# Patient Record
Sex: Male | Born: 1985 | Race: White | Hispanic: No | Marital: Married | Smoking: Never smoker
Health system: Southern US, Community
[De-identification: ages and names within clinical notes are randomized; demographics above are authoritative.]

## PROBLEM LIST (undated history)

## (undated) DIAGNOSIS — E119 Type 2 diabetes mellitus without complications: Secondary | ICD-10-CM

## (undated) HISTORY — PX: CYST REMOVAL NECK: SHX6281

## (undated) HISTORY — PX: TONSILLECTOMY: SUR1361

---

## 2006-01-16 ENCOUNTER — Emergency Department: Payer: Self-pay | Admitting: Emergency Medicine

## 2010-03-10 ENCOUNTER — Emergency Department: Payer: Self-pay | Admitting: Emergency Medicine

## 2010-03-14 ENCOUNTER — Ambulatory Visit: Payer: Self-pay | Admitting: Surgery

## 2013-04-09 ENCOUNTER — Other Ambulatory Visit: Payer: Self-pay | Admitting: Surgery

## 2013-04-09 LAB — BASIC METABOLIC PANEL
ANION GAP: 5 — AB (ref 7–16)
BUN: 13 mg/dL (ref 7–18)
Calcium, Total: 9.8 mg/dL (ref 8.5–10.1)
Chloride: 102 mmol/L (ref 98–107)
Co2: 30 mmol/L (ref 21–32)
Creatinine: 1.2 mg/dL (ref 0.60–1.30)
EGFR (Non-African Amer.): >60
Glucose: 122 mg/dL — ABNORMAL HIGH (ref 65–99)
OSMOLALITY: 275 (ref 275–301)
Potassium: 4 mmol/L (ref 3.5–5.1)
Sodium: 137 mmol/L (ref 136–145)

## 2013-04-09 LAB — CBC WITH DIFFERENTIAL/PLATELET
Basophil #: 0 10*3/uL (ref 0.0–0.1)
Basophil %: 0.3 %
Eosinophil #: 0.1 10*3/uL (ref 0.0–0.7)
Eosinophil %: 1.3 %
HCT: 43.7 % (ref 40.0–52.0)
HGB: 15.1 g/dL (ref 13.0–18.0)
LYMPHS ABS: 2.6 10*3/uL (ref 1.0–3.6)
Lymphocyte %: 24.2 %
MCH: 32.6 pg (ref 26.0–34.0)
MCHC: 34.7 g/dL (ref 32.0–36.0)
MCV: 94 fL (ref 80–100)
MONO ABS: 0.7 x10 3/mm (ref 0.2–1.0)
MONOS PCT: 6.8 %
Neutrophil #: 7.3 10*3/uL — ABNORMAL HIGH (ref 1.4–6.5)
Neutrophil %: 67.4 %
Platelet: 366 10*3/uL (ref 150–440)
RBC: 4.65 10*6/uL (ref 4.40–5.90)
RDW: 12.5 % (ref 11.5–14.5)
WBC: 10.8 10*3/uL — AB (ref 3.8–10.6)

## 2013-05-07 ENCOUNTER — Ambulatory Visit: Payer: Self-pay | Admitting: Surgery

## 2013-05-10 LAB — PATHOLOGY REPORT

## 2014-03-06 ENCOUNTER — Emergency Department: Payer: Self-pay | Admitting: Internal Medicine

## 2014-05-24 ENCOUNTER — Ambulatory Visit: Payer: Self-pay | Admitting: Family Medicine

## 2014-07-16 NOTE — Op Note (Signed)
PATIENT NAME:  Zachary JewettBROOKS, Adrienne A MR#:  [id number] DATE OF BIRTH:  Sep 30, 1985  DATE OF PROCEDURE:  05/07/2013  PREOPERATIVE DIAGNOSIS: Posterior neck mass.   POSTOPERATIVE DIAGNOSIS: Posterior neck mass.   PROCEDURE: Excisional biopsy of a posterior neck mass.   SURGEON: Dionne Miloichard Welby Montminy, M.D.   ASSISTANT: Parks RangerBethany Applebaum, PA-S  ANESTHESIA: IV sedation and local anesthetic.   INDICATIONS: This is a patient with a posterior neck mass. It has been incised and drained in the past for infection and now is growing and recurring. He requests excisional biopsy.   Of note, his family has history of malignant hyperthermia and therefore we are doing this under IV sedation and local anesthetic to avoid triggering agents.  Preoperatively, we discussed rationale for surgery, the options of observation, risk of bleeding, infection, recurrence, and cosmetic deformity including open wound. This was all reviewed for him in the preop holding area. No family was present.   FINDINGS: 2 cm mass excised via a 5 cm incision, closed in an intermediate fashion.   DESCRIPTION OF PROCEDURE: The patient was placed in a well-padded prone position, prepped and draped in a sterile fashion. He was given IV sedation and then lidocaine with epinephrine was infiltrated in skin and subcutaneous tissues. On the previously marked visible and palpable mass, a lenticular-shaped incision was drawn out then executed with sharp dissection and excised and sent off for examination, labeled posterior neck mass. The area was checked for hemostasis with electrocautery and found to be adequate. The wound was re-infiltrated with Marcaine for a total of 30 mL, and then once assuring that hemostasis was adequate, the wound was closed in 2 layers with 2-0 Vicryl followed by 4-0 subcuticular Monocryl. Steri-Strips, Mastisol, and sterile dressings were placed.   The patient tolerated the procedure well. There were no complications. He was taken to  the recovery room in stable condition to be discharged in the care of his family. Follow up in 10 days.  ____________________________ Adah Salvageichard E. Excell Seltzerooper, MD rec:sb D: 05/07/2013 09:04:04 ET T: 05/07/2013 09:19:12 ET JOB#: [id number]  cc: Adah Salvageichard E. Excell Seltzerooper, MD, <Dictator> Lattie HawICHARD E Eligh Rybacki MD ELECTRONICALLY SIGNED 05/07/2013 13:05

## 2015-06-21 ENCOUNTER — Encounter: Payer: Self-pay | Admitting: Emergency Medicine

## 2015-06-21 ENCOUNTER — Emergency Department
Admission: EM | Admit: 2015-06-21 | Discharge: 2015-06-21 | Disposition: A | Discharge status: Home / Self Care | Payer: BLUE CROSS/BLUE SHIELD | Attending: Emergency Medicine | Admitting: Emergency Medicine

## 2015-06-21 DIAGNOSIS — E138 Other specified diabetes mellitus with unspecified complications: Secondary | ICD-10-CM | POA: Diagnosis not present

## 2015-06-21 DIAGNOSIS — R42 Dizziness and giddiness: Secondary | ICD-10-CM | POA: Diagnosis present

## 2015-06-21 HISTORY — DX: Type 2 diabetes mellitus without complications: E11.9

## 2015-06-21 LAB — URINALYSIS COMPLETE WITH MICROSCOPIC (ARMC ONLY)
BILIRUBIN URINE: NEGATIVE
Bacteria, UA: NONE SEEN
Glucose, UA: >500 mg/dL — AB
Hgb urine dipstick: NEGATIVE
LEUKOCYTES UA: NEGATIVE
Nitrite: NEGATIVE
PH: 6 (ref 5.0–8.0)
Protein, ur: NEGATIVE mg/dL
SPECIFIC GRAVITY, URINE: 1.035 — AB (ref 1.005–1.030)
SQUAMOUS EPITHELIAL / LPF: NONE SEEN

## 2015-06-21 LAB — CBC WITH DIFFERENTIAL/PLATELET
Basophils Absolute: 0.1 10*3/uL (ref 0–0.1)
Basophils Relative: 1 %
Eosinophils Absolute: 0.1 10*3/uL (ref 0–0.7)
Eosinophils Relative: 1 %
HEMATOCRIT: 47.5 % (ref 40.0–52.0)
HEMOGLOBIN: 16.1 g/dL (ref 13.0–18.0)
LYMPHS ABS: 3.2 10*3/uL (ref 1.0–3.6)
Lymphocytes Relative: 27 %
MCH: 31.7 pg (ref 26.0–34.0)
MCHC: 33.8 g/dL (ref 32.0–36.0)
MCV: 93.8 fL (ref 80.0–100.0)
MONO ABS: 0.7 10*3/uL (ref 0.2–1.0)
MONOS PCT: 6 %
NEUTROS ABS: 7.5 10*3/uL — AB (ref 1.4–6.5)
NEUTROS PCT: 65 %
Platelets: 303 10*3/uL (ref 150–440)
RBC: 5.06 MIL/uL (ref 4.40–5.90)
RDW: 13 % (ref 11.5–14.5)
WBC: 11.6 10*3/uL — ABNORMAL HIGH (ref 3.8–10.6)

## 2015-06-21 LAB — GLUCOSE, CAPILLARY
Glucose-Capillary: 514 mg/dL — ABNORMAL HIGH (ref 65–99)
Glucose-Capillary: 395 mg/dL — ABNORMAL HIGH (ref 65–99)
Glucose-Capillary: 307 mg/dL — ABNORMAL HIGH (ref 65–99)

## 2015-06-21 LAB — COMPREHENSIVE METABOLIC PANEL
ALK PHOS: 115 U/L (ref 38–126)
ALT: 58 U/L (ref 17–63)
ANION GAP: 13 (ref 5–15)
AST: 30 U/L (ref 15–41)
Albumin: 5.5 g/dL — ABNORMAL HIGH (ref 3.5–5.0)
BILIRUBIN TOTAL: 1.3 mg/dL — AB (ref 0.3–1.2)
BUN: 18 mg/dL (ref 6–20)
CALCIUM: 9.9 mg/dL (ref 8.9–10.3)
CO2: 27 mmol/L (ref 22–32)
Chloride: 90 mmol/L — ABNORMAL LOW (ref 101–111)
Creatinine, Ser: 1.16 mg/dL (ref 0.61–1.24)
GLUCOSE: 531 mg/dL — AB (ref 65–99)
POTASSIUM: 3.7 mmol/L (ref 3.5–5.1)
Sodium: 130 mmol/L — ABNORMAL LOW (ref 135–145)
TOTAL PROTEIN: 8.3 g/dL — AB (ref 6.5–8.1)

## 2015-06-21 MED ORDER — SODIUM CHLORIDE 0.9 % IV BOLUS (SEPSIS)
1000.0000 mL | Freq: Once | INTRAVENOUS | Status: AC
  Administered 2015-06-21: 1000 mL via INTRAVENOUS

## 2015-06-21 MED ORDER — BLOOD GLUCOSE MONITOR KIT: PACK | Status: DC

## 2015-06-21 MED ORDER — METFORMIN HCL 500 MG PO TABS: 500.0000 mg | ORAL_TABLET | Freq: Two times a day (BID) | ORAL | Status: DC

## 2015-06-21 MED ORDER — METFORMIN HCL 500 MG PO TABS
ORAL_TABLET | ORAL | Status: AC
  Administered 2015-06-21: 500 mg via ORAL
  Filled 2015-06-21: qty 1

## 2015-06-21 MED ORDER — METFORMIN HCL 500 MG PO TABS
500.0000 mg | ORAL_TABLET | Freq: Once | ORAL | Status: AC
  Administered 2015-06-21: 500 mg via ORAL
  Filled 2015-06-21: qty 1

## 2015-06-21 MED ORDER — DIPHENHYDRAMINE HCL 50 MG/ML IJ SOLN
INTRAMUSCULAR | Status: AC
  Filled 2015-06-21: qty 1

## 2015-06-21 MED ORDER — INSULIN ASPART 100 UNIT/ML ~~LOC~~ SOLN
6.0000 [IU] | Freq: Once | SUBCUTANEOUS | Status: AC
  Administered 2015-06-21: 6 [IU] via INTRAVENOUS

## 2015-06-21 MED ORDER — INSULIN ASPART 100 UNIT/ML ~~LOC~~ SOLN
SUBCUTANEOUS | Status: AC
  Filled 2015-06-21: qty 6

## 2015-06-21 MED ORDER — DIPHENHYDRAMINE HCL 50 MG/ML IJ SOLN
25.0000 mg | Freq: Once | INTRAMUSCULAR | Status: AC
  Administered 2015-06-21: 25 mg via INTRAVENOUS

## 2015-06-21 NOTE — Discharge Instructions (Signed)
Please seek medical attention for any high fevers, chest pain, shortness of breath, change in behavior, persistent vomiting, bloody stool or any other new or concerning symptoms.  Diabetes and Standards of Medical Care Diabetes is complicated. You may find that your diabetes team includes a dietitian, nurse, diabetes educator, eye doctor, and more. To help everyone know what is going on and to help you get the care you deserve, the following schedule of care was developed to help keep you on track. Below are the tests, exams, vaccines, medicines, education, and plans you will need. HbA1c test This test shows how well you have controlled your glucose over the past 2-3 months. It is used to see if your diabetes management plan needs to be adjusted.   It is performed at least 2 times a year if you are meeting treatment goals.  It is performed 4 times a year if therapy has changed or if you are not meeting treatment goals. Blood pressure test  This test is performed at every routine medical visit. The goal is less than 140/90 mm Hg for most people, but 130/80 mm Hg in some cases. Ask your health care provider about your goal. Dental exam  Follow up with the dentist regularly. Eye exam  If you are diagnosed with type 1 diabetes as a child, get an exam upon reaching the age of 58 years or older and having had diabetes for 3-5 years. Yearly eye exams are recommended after that initial eye exam.  If you are diagnosed with type 1 diabetes as an adult, get an exam within 5 years of diagnosis and then yearly.  If you are diagnosed with type 2 diabetes, get an exam as soon as possible after the diagnosis and then yearly. Foot care exam  Visual foot exams are performed at every routine medical visit. The exams check for cuts, injuries, or other problems with the feet.  You should have a complete foot exam performed every year. This exam includes an inspection of the structure and skin of your feet, a  check of the pulses in your feet, and a check of the sensation in your feet.  Type 1 diabetes: The first exam is performed 5 years after diagnosis.  Type 2 diabetes: The first exam is performed at the time of diagnosis.  Check your feet nightly for cuts, injuries, or other problems with your feet. Tell your health care provider if anything is not healing. Kidney function test (urine microalbumin)  This test is performed once a year.  Type 1 diabetes: The first test is performed 5 years after diagnosis.  Type 2 diabetes: The first test is performed at the time of diagnosis.  A serum creatinine and estimated glomerular filtration rate (eGFR) test is done once a year to assess the level of chronic kidney disease (CKD), if present. Lipid profile (cholesterol, HDL, LDL, triglycerides)  Performed every 5 years for most people.  The goal for LDL is less than 100 mg/dL. If you are at high risk, the goal is less than 70 mg/dL.  The goal for HDL is 40 mg/dL-50 mg/dL for men and 50 mg/dL-60 mg/dL for women. An HDL cholesterol of 60 mg/dL or higher gives some protection against heart disease.  The goal for triglycerides is less than 150 mg/dL. Immunizations  The flu (influenza) vaccine is recommended yearly for every person 19 months of age or older who has diabetes.  The pneumonia (pneumococcal) vaccine is recommended for every person 29 years of age  or older who has diabetes. Adults 75 years of age or older may receive the pneumonia vaccine as a series of two separate shots.  The hepatitis B vaccine is recommended for adults shortly after they have been diagnosed with diabetes.  The Tdap (tetanus, diphtheria, and pertussis) vaccine should be given:  According to normal childhood vaccination schedules, for children.  Every 10 years, for adults who have diabetes. Diabetes self-management education  Education is recommended at diagnosis and ongoing as needed. Treatment plan  Your  treatment plan is reviewed at every medical visit.   This information is not intended to replace advice given to you by your health care provider. Make sure you discuss any questions you have with your health care provider.   Document Released: 01/06/2009 Document Revised: 04/01/2014 Document Reviewed: 08/11/2012 Elsevier Interactive Patient Education [employer].

## 2015-06-21 NOTE — ED Notes (Addendum)
C/O increased thirst x 1 month, worsening over past few weeks.  Also c/o dizziness, intermittent blurred vision, frequent urination at night.  2015 diagnosed with diabetes, not started on medications at that time.

## 2015-06-21 NOTE — ED Notes (Signed)
C/o itching red rash to bilateral forearms.  Flat red rash noted to forearms.  No SOB/ DOE.  Will alert Dr. Derrill KayGoodman.

## 2015-06-21 NOTE — ED Provider Notes (Signed)
[facility] Emergency Department Provider Note    ____________________________________________  Time seen: ~1900  I have reviewed the triage vital signs and the nursing notes.   HISTORY  Chief Complaint Dizziness   History limited by: Not Limited   HPI Zachary Alexander is a 30 y.o. male who presents to the emergency department today because of concerns for increased thirst, increased urination and some weakness. He states he started having the increased thirst and urination about one month ago. Has progressively gotten worse. It is severe. He drinks multiple counts of fluid each day. He urinates multiple times including at night. He states that 2 years ago he was told he had borderline diabetes and was told that he could likely manage it with exercise and diet. States he does have some family history of diabetes. He states roughly 1 month ago he was treated with antibiotics for URI. Denies any recent fevers.    Past Medical History  Diagnosis Date  . Diabetes mellitus without complication (HCC)     There are no active problems to display for this patient.   Past Surgical History  Procedure Laterality Date  . Tonsillectomy      No current outpatient prescriptions on file.  Allergies Penicillins and Codeine  No family history on file.  Social History Social History  Substance Use Topics  . Smoking status: Never Smoker   . Smokeless tobacco: Never Used  . Alcohol Use: Yes     Comment: rare    Review of Systems  Constitutional: Negative for fever. Cardiovascular: Negative for chest pain. Respiratory: Negative for shortness of breath. Gastrointestinal: Negative for abdominal pain, vomiting and diarrhea. Neurological: Negative for headaches, focal weakness or numbness.  10-point ROS otherwise negative.  ____________________________________________   PHYSICAL EXAM:  VITAL SIGNS: ED Triage Vitals  Enc Vitals Group     BP 06/21/15  1845 115/113 mmHg     Pulse Rate 06/21/15 1845 108     Resp 06/21/15 1845 18     Temp 06/21/15 1845 98.8 F (37.1 C)     Temp Source 06/21/15 1845 Oral     SpO2 --      Weight 06/21/15 1845 255 lb (115.667 kg)     Height 06/21/15 1845 5\' 10"  (1.778 m)     Head Cir --      Peak Flow --      Pain Score 06/21/15 1850 2   Constitutional: Alert and oriented. Well appearing and in no distress. Eyes: Conjunctivae are normal. PERRL. Normal extraocular movements. ENT   Head: Normocephalic and atraumatic.   Nose: No congestion/rhinnorhea.   Mouth/Throat: Mucous membranes are moist.   Neck: No stridor. Hematological/Lymphatic/Immunilogical: No cervical lymphadenopathy. Cardiovascular: Tachycardic, regular rhythm.  No murmurs, rubs, or gallops. Respiratory: Normal respiratory effort without tachypnea nor retractions. Breath sounds are clear and equal bilaterally. No wheezes/rales/rhonchi. Gastrointestinal: Soft and nontender. No distention.  Genitourinary: Deferred Musculoskeletal: Normal range of motion in all extremities. No joint effusions.  No lower extremity tenderness nor edema. Neurologic:  Normal speech and language. No gross focal neurologic deficits are appreciated.  Skin:  Skin is warm, dry and intact. No rash noted. Psychiatric: Mood and affect are normal. Speech and behavior are normal. Patient exhibits appropriate insight and judgment.  ____________________________________________    LABS (pertinent positives/negatives)  Labs Reviewed  GLUCOSE, CAPILLARY - Abnormal; Notable for the following:    Glucose-Capillary 514 (*)    All other components within normal limits  CBC WITH DIFFERENTIAL/PLATELET -  Abnormal; Notable for the following:    WBC 11.6 (*)    Neutro Abs 7.5 (*)    All other components within normal limits  COMPREHENSIVE METABOLIC PANEL - Abnormal; Notable for the following:    Sodium 130 (*)    Chloride 90 (*)    Glucose, Bld 531 (*)    Total  Protein 8.3 (*)    Albumin 5.5 (*)    Total Bilirubin 1.3 (*)    All other components within normal limits  URINALYSIS COMPLETEWITH MICROSCOPIC ([facility] ONLY) - Abnormal; Notable for the following:    Color, Urine STRAW (*)    APPearance CLEAR (*)    Glucose, UA >500 (*)    Ketones, ur 1+ (*)    Specific Gravity, Urine 1.035 (*)    All other components within normal limits  GLUCOSE, CAPILLARY - Abnormal; Notable for the following:    Glucose-Capillary 395 (*)    All other components within normal limits  GLUCOSE, CAPILLARY - Abnormal; Notable for the following:    Glucose-Capillary 307 (*)    All other components within normal limits  HEMOGLOBIN A1C     ____________________________________________   EKG  None  ____________________________________________    RADIOLOGY  None  ____________________________________________   PROCEDURES  Procedure(s) performed: None  Critical Care performed: No  ____________________________________________   INITIAL IMPRESSION / ASSESSMENT AND PLAN / ED COURSE  Pertinent labs & imaging results that were available during my care of the patient were reviewed by me and considered in my medical decision making (see chart for details).  Patient presented to the emergency department today because of concerns for polyuria and polydipsia. Initial blood sugar was 514. Patient states that he was diagnosed borderline diabetes couple years ago. Porcine to think this and verbalizes the patient now has diabetes. Patient was given IV fluids and a small dose of insulin. Sugars did improve. Patient states he felt better. Do not think DKA currently. Will start patient on metformin.   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

## 2015-06-22 ENCOUNTER — Encounter: Payer: Self-pay | Admitting: Emergency Medicine

## 2015-06-22 DIAGNOSIS — E1165 Type 2 diabetes mellitus with hyperglycemia: Secondary | ICD-10-CM | POA: Insufficient documentation

## 2015-06-22 LAB — GLUCOSE, CAPILLARY: Glucose-Capillary: 483 mg/dL — ABNORMAL HIGH (ref 65–99)

## 2015-06-22 LAB — HEMOGLOBIN A1C: Hgb A1c MFr Bld: 12.5 % — ABNORMAL HIGH (ref 4.0–6.0)

## 2015-06-22 NOTE — ED Notes (Signed)
Pt presents to ED with elevated blood sugar. Recently taken antibiotic and pt states he has been dizzy, excessively thirsty, frequent urination. Dx about a year ago as a "boarderline diabetic" to be controlled with diet and has not been prescribed any medications. Seen last night for the same in this ED and the lowest his blood sugar came down to 309 and pt was discharged. Followed up with his pcp via telephone and was prescribed medications but his sugar has continued to increase today. fsbs 447 just prior to arrival (highest today prior to taking prescribed medications fSBS was 542)

## 2015-06-23 ENCOUNTER — Emergency Department
Admission: EM | Admit: 2015-06-23 | Discharge: 2015-06-23 | Disposition: A | Discharge status: Home / Self Care | Payer: BLUE CROSS/BLUE SHIELD | Attending: Emergency Medicine | Admitting: Emergency Medicine

## 2015-06-23 ENCOUNTER — Emergency Department: Payer: BLUE CROSS/BLUE SHIELD

## 2015-06-23 DIAGNOSIS — E119 Type 2 diabetes mellitus without complications: Secondary | ICD-10-CM

## 2015-06-23 LAB — CBC
HEMATOCRIT: 45.2 % (ref 40.0–52.0)
Hemoglobin: 15.7 g/dL (ref 13.0–18.0)
MCH: 32.6 pg (ref 26.0–34.0)
MCHC: 34.8 g/dL (ref 32.0–36.0)
MCV: 93.6 fL (ref 80.0–100.0)
Platelets: 251 10*3/uL (ref 150–440)
RBC: 4.82 MIL/uL (ref 4.40–5.90)
RDW: 12.7 % (ref 11.5–14.5)
WBC: 9.8 10*3/uL (ref 3.8–10.6)

## 2015-06-23 LAB — BLOOD GAS, VENOUS
Acid-Base Excess: 0.5 mmol/L (ref 0.0–3.0)
BICARBONATE: 25.4 meq/L (ref 21.0–28.0)
O2 Saturation: 86.4 %
PATIENT TEMPERATURE: 37
PH VEN: 7.4 (ref 7.320–7.430)
pCO2, Ven: 41 mmHg — ABNORMAL LOW (ref 44.0–60.0)
pO2, Ven: 52 mmHg — ABNORMAL HIGH (ref 31.0–45.0)

## 2015-06-23 LAB — URINALYSIS COMPLETE WITH MICROSCOPIC (ARMC ONLY)
Bacteria, UA: NONE SEEN
Bilirubin Urine: NEGATIVE
Glucose, UA: >1000 mg/dL — AB
HGB URINE DIPSTICK: NEGATIVE
LEUKOCYTES UA: NEGATIVE
Nitrite: NEGATIVE
PH: 6 (ref 5.0–8.0)
PROTEIN: NEGATIVE mg/dL
RBC / HPF: NONE SEEN RBC/hpf (ref 0–5)
SPECIFIC GRAVITY, URINE: 1.005 (ref 1.005–1.030)
Squamous Epithelial / LPF: NONE SEEN

## 2015-06-23 LAB — COMPREHENSIVE METABOLIC PANEL
ALBUMIN: 4.6 g/dL (ref 3.5–5.0)
ALT: 56 U/L (ref 17–63)
AST: 37 U/L (ref 15–41)
Alkaline Phosphatase: 98 U/L (ref 38–126)
Anion gap: 10 (ref 5–15)
BILIRUBIN TOTAL: 0.6 mg/dL (ref 0.3–1.2)
BUN: 20 mg/dL (ref 6–20)
CHLORIDE: 98 mmol/L — AB (ref 101–111)
CO2: 22 mmol/L (ref 22–32)
Calcium: 9.5 mg/dL (ref 8.9–10.3)
Creatinine, Ser: 1.05 mg/dL (ref 0.61–1.24)
GFR calc Af Amer: >60 mL/min (ref >60)
GFR calc non Af Amer: >60 mL/min (ref >60)
GLUCOSE: 475 mg/dL — AB (ref 65–99)
POTASSIUM: 4 mmol/L (ref 3.5–5.1)
Sodium: 130 mmol/L — ABNORMAL LOW (ref 135–145)
TOTAL PROTEIN: 7.3 g/dL (ref 6.5–8.1)

## 2015-06-23 LAB — GLUCOSE, CAPILLARY
GLUCOSE-CAPILLARY: 354 mg/dL — AB (ref 65–99)
Glucose-Capillary: 341 mg/dL — ABNORMAL HIGH (ref 65–99)
Glucose-Capillary: 223 mg/dL — ABNORMAL HIGH (ref 65–99)

## 2015-06-23 MED ORDER — GLIPIZIDE 5 MG PO TABS: 5.0000 mg | ORAL_TABLET | Freq: Every day | ORAL | Status: DC

## 2015-06-23 MED ORDER — INSULIN ASPART 100 UNIT/ML ~~LOC~~ SOLN: 6.0000 [IU] | Freq: Once | SUBCUTANEOUS | Status: DC

## 2015-06-23 MED ORDER — SODIUM CHLORIDE 0.9 % IV BOLUS (SEPSIS)
1000.0000 mL | Freq: Once | INTRAVENOUS | Status: AC
  Administered 2015-06-23: 1000 mL via INTRAVENOUS

## 2015-06-23 MED ORDER — MAGNESIUM CITRATE PO SOLN
1.0000 | Freq: Once | ORAL | Status: AC
  Administered 2015-06-23: 1 via ORAL
  Filled 2015-06-23: qty 296

## 2015-06-23 MED ORDER — SENNOSIDES-DOCUSATE SODIUM 8.6-50 MG PO TABS
2.0000 | ORAL_TABLET | Freq: Once | ORAL | Status: AC
  Administered 2015-06-23: 2 via ORAL
  Filled 2015-06-23: qty 2

## 2015-06-23 MED ORDER — INSULIN ASPART 100 UNIT/ML ~~LOC~~ SOLN
6.0000 [IU] | Freq: Once | SUBCUTANEOUS | Status: AC
  Administered 2015-06-23: 6 [IU] via INTRAVENOUS
  Filled 2015-06-23: qty 6

## 2015-06-23 MED ORDER — INSULIN ASPART 100 UNIT/ML ~~LOC~~ SOLN
8.0000 [IU] | Freq: Once | SUBCUTANEOUS | Status: AC
  Administered 2015-06-23: 8 [IU] via INTRAVENOUS
  Filled 2015-06-23: qty 8

## 2015-06-23 NOTE — ED Notes (Signed)
RN called to room - patient with reports of IV site being painful. Site assessed; no redness or signs of infiltration noted. Site with brisk blood return. IVFs slowed; will follow up with patient.

## 2015-06-23 NOTE — Discharge Instructions (Signed)
Blood Glucose Monitoring, Adult Monitoring your blood glucose (also know as blood sugar) helps you to manage your diabetes. It also helps you and your health care provider monitor your diabetes and determine how well your treatment plan is working. WHY SHOULD YOU MONITOR YOUR BLOOD GLUCOSE?  It can help you understand how food, exercise, and medicine affect your blood glucose.  It allows you to know what your blood glucose is at any given moment. You can quickly tell if you are having low blood glucose (hypoglycemia) or high blood glucose (hyperglycemia).  It can help you and your health care provider know how to adjust your medicines.  It can help you understand how to manage an illness or adjust medicine for exercise. WHEN SHOULD YOU TEST? Your health care provider will help you decide how often you should check your blood glucose. This may depend on the type of diabetes you have, your diabetes control, or the types of medicines you are taking. Be sure to write down all of your blood glucose readings so that this information can be reviewed with your health care provider. See below for examples of testing times that your health care provider may suggest. Type 1 Diabetes  Test at least 2 times per day if your diabetes is well controlled, if you are using an insulin pump, or if you perform multiple daily injections.  If your diabetes is not well controlled or if you are sick, you may need to test more often.  It is a good idea to also test:  Before every insulin injection.  Before and after exercise.  Between meals and 2 hours after a meal.  Occasionally between 2:00 a.m. and 3:00 a.m. Type 2 Diabetes  If you are taking insulin, test at least 2 times per day. However, it is best to test before every insulin injection.  If you take medicines by mouth (orally), test 2 times a day.  If you are on a controlled diet, test once a day.  If your diabetes is not well controlled or if you  are sick, you may need to monitor more often. HOW TO MONITOR YOUR BLOOD GLUCOSE Supplies Needed  Blood glucose meter.  Test strips for your meter. Each meter has its own strips. You must use the strips that go with your own meter.  A pricking needle (lancet).  A device that holds the lancet (lancing device).  A journal or log book to write down your results. Procedure  Wash your hands with soap and water. Alcohol is not preferred.  Prick the side of your finger (not the tip) with the lancet.  Gently milk the finger until a small drop of blood appears.  Follow the instructions that come with your meter for inserting the test strip, applying blood to the strip, and using your blood glucose meter. Other Areas to Get Blood for Testing Some meters allow you to use other areas of your body (other than your finger) to test your blood. These areas are called alternative sites. The most common alternative sites are:  The forearm.  The thigh.  The back area of the lower leg.  The palm of the hand. The blood flow in these areas is slower. Therefore, the blood glucose values you get may be delayed, and the numbers are different from what you would get from your fingers. Do not use alternative sites if you think you are having hypoglycemia. Your reading will not be accurate. Always use a finger if you are  having hypoglycemia. Also, if you cannot feel your lows (hypoglycemia unawareness), always use your fingers for your blood glucose checks. ADDITIONAL TIPS FOR GLUCOSE MONITORING  Do not reuse lancets.  Always carry your supplies with you.  All blood glucose meters have a 24-hour "hotline" number to call if you have questions or need help.  Adjust (calibrate) your blood glucose meter with a control solution after finishing a few boxes of strips. BLOOD GLUCOSE RECORD KEEPING It is a good idea to keep a daily record or log of your blood glucose readings. Most glucose meters, if not all,  keep your glucose records stored in the meter. Some meters come with the ability to download your records to your home computer. Keeping a record of your blood glucose readings is especially helpful if you are wanting to look for patterns. Make notes to go along with the blood glucose readings because you might forget what happened at that exact time. Keeping good records helps you and your health care provider to work together to achieve good diabetes management.    This information is not intended to replace advice given to you by your health care provider. Make sure you discuss any questions you have with your health care provider.   Document Released: 03/14/2003 Document Revised: 04/01/2014 Document Reviewed: 08/03/2012 Elsevier Interactive Patient Education 2016 [employer].  Diabetes and Exercise Exercising regularly is important. It is not just about losing weight. It has many health benefits, such as:  Improving your overall fitness, flexibility, and endurance.  Increasing your bone density.  Helping with weight control.  Decreasing your body fat.  Increasing your muscle strength.  Reducing stress and tension.  Improving your overall health. People with diabetes who exercise gain additional benefits because exercise:  Reduces appetite.  Improves the body's use of blood sugar (glucose).  Helps lower or control blood glucose.  Decreases blood pressure.  Helps control blood lipids (such as cholesterol and triglycerides).  Improves the body's use of the hormone insulin by:  Increasing the body's insulin sensitivity.  Reducing the body's insulin needs.  Decreases the risk for heart disease because exercising:  Lowers cholesterol and triglycerides levels.  Increases the levels of good cholesterol (such as high-density lipoproteins [HDL]) in the body.  Lowers blood glucose levels. YOUR ACTIVITY PLAN  Choose an activity that you enjoy, and set realistic goals. To  exercise safely, you should begin practicing any new physical activity slowly, and gradually increase the intensity of the exercise over time. Your health care provider or diabetes educator can help create an activity plan that works for you. General recommendations include:  Encouraging children to engage in at least 60 minutes of physical activity each day.  Stretching and performing strength training exercises, such as yoga or weight lifting, at least 2 times per week.  Performing a total of at least 150 minutes of moderate-intensity exercise each week, such as brisk walking or water aerobics.  Exercising at least 3 days per week, making sure you allow no more than 2 consecutive days to pass without exercising.  Avoiding long periods of inactivity (90 minutes or more). When you have to spend an extended period of time sitting down, take frequent breaks to walk or stretch. RECOMMENDATIONS FOR EXERCISING WITH TYPE 1 OR TYPE 2 DIABETES   Check your blood glucose before exercising. If blood glucose levels are greater than 240 mg/dL, check for urine ketones. Do not exercise if ketones are present.  Avoid injecting insulin into areas of the  body that are going to be exercised. For example, avoid injecting insulin into:  The arms when playing tennis.  The legs when jogging.  Keep a record of:  Food intake before and after you exercise.  Expected peak times of insulin action.  Blood glucose levels before and after you exercise.  The type and amount of exercise you have done.  Review your records with your health care provider. Your health care provider will help you to develop guidelines for adjusting food intake and insulin amounts before and after exercising.  If you take insulin or oral hypoglycemic agents, watch for signs and symptoms of hypoglycemia. They include:  Dizziness.  Shaking.  Sweating.  Chills.  Confusion.  Drink plenty of water while you exercise to prevent  dehydration or heat stroke. Body water is lost during exercise and must be replaced.  Talk to your health care provider before starting an exercise program to make sure it is safe for you. Remember, almost any type of activity is better than none.   This information is not intended to replace advice given to you by your health care provider. Make sure you discuss any questions you have with your health care provider.   Document Released: 06/01/2003 Document Revised: 07/26/2014 Document Reviewed: 08/18/2012 Elsevier Interactive Patient Education 2016 [employer] Inc[employer]  Daily Diabetes Record Check your blood glucose (BG) as directed by your health care provider. Use this form to record your results as well as any diabetes medicines you take, including insulin. Checking your BG, recording it, and bringing your records to your health care provider is very helpful in managing your diabetes. These numbers help your health care provider know if any changes are needed to your diabetes plan.  Week of _____________________________ Date: _________  Seward Carol, BG/Medicines: ________________ / __________________________________________________________  LUNCH, BG/Medicines: ____________________ / __________________________________________________________  Laural Golden, BG/Medicines: ___________________ / __________________________________________________________  BEDTIME, BG/Medicines: __________________ / __________________________________________________________ Date: _________  Seward Carol, BG/Medicines: ________________ / __________________________________________________________  LUNCH, BG/Medicines: ____________________ / __________________________________________________________  Laural Golden, BG/Medicines: ___________________ / __________________________________________________________  BEDTIME, BG/Medicines: __________________ / __________________________________________________________ Date:  _________  Seward Carol, BG/Medicines: ________________ / __________________________________________________________  LUNCH, BG/Medicines: ____________________ / __________________________________________________________  Laural Golden, BG/Medicines: ___________________ / __________________________________________________________  BEDTIME, BG/Medicines: __________________ / __________________________________________________________ Date: _________  Seward Carol, BG/Medicines: ________________ / __________________________________________________________  LUNCH, BG/Medicines: ____________________ / __________________________________________________________  Laural Golden, BG/Medicines: ___________________ / __________________________________________________________  BEDTIME, BG/Medicines: __________________ / __________________________________________________________ Date: _________  Seward Carol, BG/Medicines: ________________ / __________________________________________________________  LUNCH, BG/Medicines: ____________________ / __________________________________________________________  Laural Golden, BG/Medicines: ___________________ / __________________________________________________________  BEDTIME, BG/Medicines: __________________ / __________________________________________________________ Date: _________  Seward Carol, BG/Medicines: ________________ / __________________________________________________________  LUNCH, BG/Medicines: ____________________ / __________________________________________________________  Laural Golden, BG/Medicines: ___________________ / __________________________________________________________  BEDTIME, BG/Medicines: __________________ / __________________________________________________________ Date: _________  Seward Carol, BG/Medicines: ________________ / __________________________________________________________  LUNCH, BG/Medicines: ____________________ /  __________________________________________________________  Laural Golden, BG/Medicines: ___________________ / __________________________________________________________  BEDTIME, BG/Medicines: __________________ / __________________________________________________________ Notes: __________________________________________________________________________________________________   This information is not intended to replace advice given to you by your health care provider. Make sure you discuss any questions you have with your health care provider.   Document Released: 02/13/2004 Document Revised: 04/01/2014 Document Reviewed: 05/05/2013 Elsevier Interactive Patient Education 2016 [employer].  How to Avoid Diabetes Problems You can do a lot to prevent or slow down diabetes problems. Following your diabetes plan and taking care of yourself can reduce your risk of serious or life-threatening complications. Below, you will find certain things you can do to prevent diabetes problems. MANAGE YOUR DIABETES Follow your health care provider's, nurse educator's, and dietitian's instructions for managing your  diabetes. They will teach you the basics of diabetes care. They can help answer questions you may have. Learn about diabetes and make healthy choices regarding eating and physical activity. Monitor your blood glucose level regularly. Your health care provider will help you decide how often to check your blood glucose level depending on your treatment goals and how well you are meeting them.  DO NOT USE NICOTINE Nicotine and diabetes are a dangerous combination. Nicotine raises your risk for diabetes problems. If you quit using nicotine, you will lower your risk for heart attack, stroke, nerve disease, and kidney disease. Your cholesterol and your blood pressure levels may improve. Your blood circulation will also improve. Do not use any tobacco products, including cigarettes, chewing tobacco, or electronic  cigarettes. If you need help quitting, ask your health care provider. KEEP YOUR BLOOD PRESSURE UNDER CONTROL Your health care provider will determine your individualized target blood pressure based on your age, your medicines, how long you have had diabetes, and any other medical conditions you have. Blood pressure consists of two numbers. Generally, the goal is to keep your top number (systolic pressure) at or below 130, and your bottom number (diastolic pressure) at or below 80. Your health care provider may recommend a lower target blood pressure reading, if appropriate. Meal planning, medicines, and exercise can help you reach your target blood pressure. Make sure your health care provider checks your blood pressure at every visit. KEEP YOUR CHOLESTEROL UNDER CONTROL Normal cholesterol levels will help prevent heart disease and stroke. These are the biggest health problems for people with diabetes. Keeping cholesterol levels under control can also help with blood flow. Have your cholesterol level checked at least once a year. Your health care provider may prescribe a medicine known as a statin. Statins lower your cholesterol. If you are not taking a statin, ask your health care provider if you should be. Meal planning, exercise, and medicines can help you reach your cholesterol targets.  SCHEDULE AND KEEP YOUR ANNUAL PHYSICAL EXAMS AND EYE EXAMS Your health care provider will tell you how often he or she wants to see you depending on your plan of treatment. It is important that you keep these appointments so that possible problems can be identified early and complications can be avoided or treated.  Every visit with your health care provider should include your weight, blood pressure, and an evaluation of your blood glucose control.  Your hemoglobin A1c should be checked:  At least twice a year if you are at your goal.  Every 3 months if there are changes in treatment.  If you are not meeting  your goals.  Your blood lipids should be checked yearly. You should also be checked yearly to see if you have protein in your urine (microalbumin).  Schedule a dilated eye exam within 5 years of your diagnosis if you have type 1 diabetes, and then yearly. Schedule a dilated eye exam at diagnosis if you have type 2 diabetes, and then yearly. All exams thereafter can be extended to every 2 to 3 years if one or more exams have been normal. KEEP YOUR VACCINES CURRENT It is recommended that you receive a flu (influenza) vaccine every year. It is also recommended that you receive a pneumonia (pneumococcal) vaccine. If you are 63 years of age or older and have never received a pneumonia vaccine, this vaccine may be given as a series of two separate shots. Ask your health care provider which additional vaccines may be  recommended. TAKE CARE OF YOUR FEET  Diabetes may cause you to have a poor blood supply (circulation) to your legs and feet. Because of this, the skin may be thinner, break easier, and heal more slowly. You also may have nerve damage in your legs and feet, causing decreased feeling. You may not notice minor injuries to your feet that could lead to serious problems or infections. Taking care of your feet is very important. Visual foot exams are performed at every routine medical visit. The exams check for cuts, injuries, or other problems with the feet. A comprehensive foot exam should be done yearly. This includes visual inspection as well as assessing foot pulses and testing for loss of sensation. You should also do the following:  Inspect your feet daily for cuts, calluses, blisters, ingrown toenails, and signs of infection, such as redness, swelling, or pus.  Wash and dry your feet thoroughly, especially between the toes.  Avoid soaking your feet regularly in hot water baths.  Moisturize dry skin with lotion, avoiding areas between your toes.  Cut toenails straight across and file the  edges.  Avoid shoes that do not fit well or have areas that irritate your skin.  Avoid going barefooted or wearing only socks. Your feet need protection. TAKE CARE OF YOUR TEETH People with poorly controlled diabetes are more likely to have gum (periodontal) disease. These infections make diabetes harder to control. Periodontal diseases, if left untreated, can lead to tooth loss. Brush your teeth twice a day, floss, and see your dentist for checkups and cleaning every 6 months, or 2 times a year. ASK YOUR HEALTH CARE PROVIDER ABOUT TAKING ASPIRIN Taking aspirin daily is recommended to help prevent cardiovascular disease in people with and without diabetes. Ask your health care provider if this would benefit you and what dose he or she would recommend. DRINK RESPONSIBLY Moderate amounts of alcohol (less than 1 drink per day for adult women and less than 2 drinks per day for adult men) have a minimal effect on blood glucose if ingested with food. It is important to eat food with alcohol to avoid hypoglycemia. People should avoid alcohol if they have a history of alcohol abuse or dependence, if they are pregnant, and if they have liver disease, pancreatitis, advanced neuropathy, or severe hypertriglyceridemia. LESSEN STRESS Living with diabetes can be stressful. When you are under stress, your blood glucose may be affected in two ways:  Stress hormones may cause your blood glucose to rise.  You may be distracted from taking good care of yourself. It is a good idea to be aware of your stress level and make changes that are necessary to help you better manage challenging situations. Support groups, planned relaxation, a hobby you enjoy, meditation, healthy relationships, and exercise all work to lower your stress level. If your efforts do not seem to be helping, get help from your health care provider or a trained mental health professional.   This information is not intended to replace advice given to  you by your health care provider. Make sure you discuss any questions you have with your health care provider.   Document Released: 11/27/2010 Document Revised: 04/01/2014 Document Reviewed: 05/05/2013 Elsevier Interactive Patient Education 2016 [employer].  Tips for Eating Away From Home If You Have Diabetes Controlling your level of blood glucose, also known as blood sugar, can be challenging. It can be even more difficult when you do not prepare your own meals. The following tips can help you  manage your diabetes when you eat away from home. PLANNING AHEAD Plan ahead if you know you will be eating away from home:  Ask your health care provider how to time meals and medicine if you are taking insulin.  Make a list of restaurants near you that offer healthy choices. If they have a carry-out menu, take it home and plan what you will order ahead of time.  Look up the restaurant you want to eat at online. Many chain and fast-food restaurants list nutritional information online. Use this information to choose the healthiest options and to calculate how many carbohydrates will be in your meal.  Use a carbohydrate-counting book or mobile app to look up the carbohydrate content and serving size of the foods you want to eat.  Become familiar with serving sizes and learn to recognize how many servings are in a portion. This will allow you to estimate how many carbohydrates you can eat. FREE FOODS A "free food" is any food or drink that has less than 5 g of carbohydrates per serving. Free foods include:  Many vegetables.  Hard boiled eggs.  Nuts or seeds.  Olives.  Cheeses.  Meats. These types of foods make good appetizer choices and are often available at salad bars. Lemon juice, vinegar, or a low-calorie salad dressing of fewer than 20 calories per serving can be used as a "free" salad dressing.  CHOICES TO REDUCE CARBOHYDRATES  Substitute nonfat sweetened yogurt with a sugar-free  yogurt. Yogurt made from soy milk may also be used, but you will still want a sugar-free or plain option to choose a lower carbohydrate amount.  Ask your server to take away the bread basket or chips from your table.  Order fresh fruit. A salad bar often offers fresh fruit choices. Avoid canned fruit because it is usually packed in sugar or syrup.  Order a salad, and eat it without dressing. Or, create a "free" salad dressing.  Ask for substitutions. For example, instead of [place] fries, request an order of a vegetable such as salad, green beans, or broccoli. OTHER TIPS   If you take insulin, take the insulin once your food arrives to your table. This will ensure your insulin and food are timed correctly.  Ask your server about the portion size before your order, and ask for a take-out box if the portion has more servings than you should have. When your food comes, leave the amount you should have on the plate, and put the rest in the take-out box.  Consider splitting an entree with someone and ordering a side salad.   This information is not intended to replace advice given to you by your health care provider. Make sure you discuss any questions you have with your health care provider.   Document Released: 03/11/2005 Document Revised: 11/30/2014 Document Reviewed: 06/08/2013 Elsevier Interactive Patient Education 2016 Elsevier Inc.  Type 2 Diabetes Mellitus, Adult Type 2 diabetes mellitus, often simply referred to as type 2 diabetes, is a long-lasting (chronic) disease. In type 2 diabetes, the pancreas does not make enough insulin (a hormone), the cells are less responsive to the insulin that is made (insulin resistance), or both. Normally, insulin moves sugars from food into the tissue cells. The tissue cells use the sugars for energy. The lack of insulin or the lack of normal response to insulin causes excess sugars to build up in the blood instead of going into the tissue cells. As a  result, high blood sugar (hyperglycemia) develops.  The effect of high sugar (glucose) levels can cause many complications. Type 2 diabetes was also previously called adult-onset diabetes, but it can occur at any age.  RISK FACTORS  A person is predisposed to developing type 2 diabetes if someone in the family has the disease and also has one or more of the following primary risk factors:  Weight gain, or being overweight or obese.  An inactive lifestyle.  A history of consistently eating high-calorie foods. Maintaining a normal weight and regular physical activity can reduce the chance of developing type 2 diabetes. SYMPTOMS  A person with type 2 diabetes may not show symptoms initially. The symptoms of type 2 diabetes appear slowly. The symptoms include:  Increased thirst (polydipsia).  Increased urination (polyuria).  Increased urination during the night (nocturia).  Sudden or unexplained weight changes.  Frequent, recurring infections.  Tiredness (fatigue).  Weakness.  Vision changes, such as blurred vision.  Fruity smell to your breath.  Abdominal pain.  Nausea or vomiting.  Cuts or bruises which are slow to heal.  Tingling or numbness in the hands or feet.  An open skin wound (ulcer). DIAGNOSIS Type 2 diabetes is frequently not diagnosed until complications of diabetes are present. Type 2 diabetes is diagnosed when symptoms or complications are present and when blood glucose levels are increased. Your blood glucose level may be checked by one or more of the following blood tests:  A fasting blood glucose test. You will not be allowed to eat for at least 8 hours before a blood sample is taken.  A random blood glucose test. Your blood glucose is checked at any time of the day regardless of when you ate.  A hemoglobin A1c blood glucose test. A hemoglobin A1c test provides information about blood glucose control over the previous 3 months.  An oral glucose  tolerance test (OGTT). Your blood glucose is measured after you have not eaten (fasted) for 2 hours and then after you drink a glucose-containing beverage. TREATMENT   You may need to take insulin or diabetes medicine daily to keep blood glucose levels in the desired range.  If you use insulin, you may need to adjust the dosage depending on the carbohydrates that you eat with each meal or snack.  Lifestyle changes are recommended as part of your treatment. These may include:  Following an individualized diet plan developed by a nutritionist or dietitian.  Exercising daily. Your health care providers will set individualized treatment goals for you based on your age, your medicines, how long you have had diabetes, and any other medical conditions you have. Generally, the goal of treatment is to maintain the following blood glucose levels:  Before meals (preprandial): 80-130 mg/dL.  After meals (postprandial): below 180 mg/dL.  A1c: less than 6.5-7%. HOME CARE INSTRUCTIONS   Have your hemoglobin A1c level checked twice a year.  Perform daily blood glucose monitoring as directed by your health care provider.  Monitor urine ketones when you are ill and as directed by your health care provider.  Take your diabetes medicine or insulin as directed by your health care provider to maintain your blood glucose levels in the desired range.  Never run out of diabetes medicine or insulin. It is needed every day.  If you are using insulin, you may need to adjust the amount of insulin given based on your intake of carbohydrates. Carbohydrates can raise blood glucose levels but need to be included in your diet. Carbohydrates provide vitamins, minerals, and fiber which  are an essential part of a healthy diet. Carbohydrates are found in fruits, vegetables, whole grains, dairy products, legumes, and foods containing added sugars.  Eat healthy foods. You should make an appointment to see a registered  dietitian to help you create an eating plan that is right for you.  Lose weight if you are overweight.  Carry a medical alert card or wear your medical alert jewelry.  Carry a 15-gram carbohydrate snack with you at all times to treat low blood glucose (hypoglycemia). Some examples of 15-gram carbohydrate snacks include:  Glucose tablets, 3 or 4.  Glucose gel, 15-gram tube.  Raisins, 2 tablespoons (24 grams).  Jelly beans, 6.  Animal crackers, 8.  Regular pop, 4 ounces (120 mL).  Gummy treats, 9.  Recognize hypoglycemia. Hypoglycemia occurs with blood glucose levels of 70 mg/dL and below. The risk for hypoglycemia increases when fasting or skipping meals, during or after intense exercise, and during sleep. Hypoglycemia symptoms can include:  Tremors or shakes.  Decreased ability to concentrate.  Sweating.  Increased heart rate.  Headache.  Dry mouth.  Hunger.  Irritability.  Anxiety.  Restless sleep.  Altered speech or coordination.  Confusion.  Treat hypoglycemia promptly. If you are alert and able to safely swallow, follow the 15:15 rule:  Take 15-20 grams of rapid-acting glucose or carbohydrate. Rapid-acting options include glucose gel, glucose tablets, or 4 ounces (120 mL) of fruit juice, regular soda, or low-fat milk.  Check your blood glucose level 15 minutes after taking the glucose.  Take 15-20 grams more of glucose if the repeat blood glucose level is still 70 mg/dL or below.  Eat a meal or snack within 1 hour once blood glucose levels return to normal.  Be alert to feeling very thirsty and urinating more frequently than usual, which are early signs of hyperglycemia. An early awareness of hyperglycemia allows for prompt treatment. Treat hyperglycemia as directed by your health care provider.  Engage in at least 150 minutes of moderate-intensity physical activity a week, spread over at least 3 days of the week or as directed by your health care  provider. In addition, you should engage in resistance exercise at least 2 times a week or as directed by your health care provider. Try to spend no more than 90 minutes at one time inactive.  Adjust your medicine and food intake as needed if you start a new exercise or sport.  Follow your sick-day plan anytime you are unable to eat or drink as usual.  Do not use any tobacco products including cigarettes, chewing tobacco, or electronic cigarettes. If you need help quitting, ask your health care provider.  Limit alcohol intake to no more than 1 drink per day for nonpregnant women and 2 drinks per day for men. You should drink alcohol only when you are also eating food. Talk with your health care provider whether alcohol is safe for you. Tell your health care provider if you drink alcohol several times a week.  Keep all follow-up visits as directed by your health care provider. This is important.  Schedule an eye exam soon after the diagnosis of type 2 diabetes and then annually.  Perform daily skin and foot care. Examine your skin and feet daily for cuts, bruises, redness, nail problems, bleeding, blisters, or sores. A foot exam by a health care provider should be done annually.  Brush your teeth and gums at least twice a day and floss at least once a day. Follow up with your dentist  regularly.  Share your diabetes management plan with your workplace or school.  Keep your immunizations up to date. It is recommended that you receive a flu (influenza) vaccine every year. It is also recommended that you receive a pneumonia (pneumococcal) vaccine. If you are 33 years of age or older and have never received a pneumonia vaccine, this vaccine may be given as a series of two separate shots. Ask your health care provider which additional vaccines may be recommended.  Learn to manage stress.  Obtain ongoing diabetes education and support as needed.  Participate in or seek rehabilitation as needed to  maintain or improve independence and quality of life. Request a physical or occupational therapy referral if you are having foot or hand numbness, or difficulties with grooming, dressing, eating, or physical activity. SEEK MEDICAL CARE IF:   You are unable to eat food or drink fluids for more than 6 hours.  You have nausea and vomiting for more than 6 hours.  Your blood glucose level is over 240 mg/dL.  There is a change in mental status.  You develop an additional serious illness.  You have diarrhea for more than 6 hours.  You have been sick or have had a fever for a couple of days and are not getting better.  You have pain during any physical activity.  SEEK IMMEDIATE MEDICAL CARE IF:  You have difficulty breathing.  You have moderate to large ketone levels.   This information is not intended to replace advice given to you by your health care provider. Make sure you discuss any questions you have with your health care provider.   Document Released: 03/11/2005 Document Revised: 11/30/2014 Document Reviewed: 10/08/2011 Elsevier Interactive Patient Education [employer]

## 2015-06-23 NOTE — ED Notes (Signed)
Called to confirm orders with MD. Per Dr. Manson PasseyBrown, patient is to have a VENOUS gas rather than the entered ARTERIAL gas. Also, MD placed order for Novolog 6 units SQ; aware that patient now has a PIV in place and MD wanting to change the order to Novolog 6 units IVP. Orders to be corrected by this RN following conversation with MD.

## 2015-06-23 NOTE — ED Notes (Signed)
Patient transported to X-ray 

## 2015-06-23 NOTE — ED Provider Notes (Signed)
[facility] Emergency Department Provider Note  ____________________________________________  Time seen: 12:30 AM  I have reviewed the triage vital signs and the nursing notes.   HISTORY  Chief Complaint Hyperglycemia      HPI Zachary Alexander is a 30 y.o. male with history of newly diagnosed diabetes mellitus type 2 presents to the emergency department elevated glucose. Patient was prescribed metformin 500 mg twice a day yesterday by Dr. Archie Balboa and referred to follow up with Dr. Ola Spurr. Patient states that he missed his morning dose of metformin however didn't take his evening dose however sugars remained elevated with a glucose of 447 just prior to arrival to the emergency department. Patient states that he's been very thirsty and as a result has been drinking orange juice and chocolate milk in an attempt to hydrate himself. Patient also admits to no bowel movement in 4 days and left upper/lower quadrant discomfort intermittently.     Past Medical History  Diagnosis Date  . Diabetes mellitus without complication ([place])     There are no active problems to display for this patient.   Past Surgical History  Procedure Laterality Date  . Tonsillectomy    . Cyst removal neck      Current Outpatient Rx  Name  Route  Sig  Dispense  Refill  . blood glucose meter kit and supplies KIT      Dispense based on patient and insurance preference. Use up to four times daily as directed. (FOR ICD-9 250.00, 250.01).   1 each   0   . metFORMIN (GLUCOPHAGE) 500 MG tablet   Oral   Take 1 tablet (500 mg total) by mouth 2 (two) times daily with a meal.   60 tablet   2     Allergies Penicillins and Codeine  History reviewed. No pertinent family history.  Social History Social History  Substance Use Topics  . Smoking status: Never Smoker   . Smokeless tobacco: Never Used  . Alcohol Use: Yes     Comment: rare    Review of Systems  Constitutional:  Negative for fever. Eyes: Negative for visual changes. ENT: Negative for sore throat. Cardiovascular: Negative for chest pain. Respiratory: Negative for shortness of breath. Gastrointestinal: Negative for abdominal pain, vomiting and diarrhea. Genitourinary: Negative for dysuria. Musculoskeletal: Negative for back pain. Skin: Negative for rash. Neurological: Negative for headaches, focal weakness or numbness.   10-point ROS otherwise negative.  ____________________________________________   PHYSICAL EXAM:  VITAL SIGNS: ED Triage Vitals  Enc Vitals Group     BP 06/22/15 2354 147/105 mmHg     Pulse Rate 06/22/15 2354 75     Resp 06/22/15 2354 18     Temp 06/22/15 2354 98.1 F (36.7 C)     Temp Source 06/22/15 2354 Oral     SpO2 06/22/15 2354 96 %     Weight 06/22/15 2354 255 lb (115.667 kg)     Height 06/22/15 2354 5' 11"  (1.803 m)     Head Cir --      Peak Flow --      Pain Score 06/22/15 2356 0     Pain Loc --      Pain Edu? --      Excl. in [place]? --      Constitutional: Alert and oriented. Well appearing and in no distress. Eyes: Conjunctivae are normal. PERRL. Normal extraocular movements. ENT   Head: Normocephalic and atraumatic.   Nose: No congestion/rhinnorhea.   Mouth/Throat: Mucous membranes  are moist.   Neck: No stridor. Hematological/Lymphatic/Immunilogical: No cervical lymphadenopathy. Cardiovascular: Normal rate, regular rhythm. Normal and symmetric distal pulses are present in all extremities. No murmurs, rubs, or gallops. Respiratory: Normal respiratory effort without tachypnea nor retractions. Breath sounds are clear and equal bilaterally. No wheezes/rales/rhonchi. Gastrointestinal: Soft and nontender. No distention. There is no CVA tenderness. Genitourinary: deferred Musculoskeletal: Nontender with normal range of motion in all extremities. No joint effusions.  No lower extremity tenderness nor edema. Neurologic:  Normal speech and  language. No gross focal neurologic deficits are appreciated. Speech is normal.  Skin:  Skin is warm, dry and intact. No rash noted. Psychiatric: Mood and affect are normal. Speech and behavior are normal. Patient exhibits appropriate insight and judgment.  ____________________________________________    LABS (pertinent positives/negatives)  Labs Reviewed  GLUCOSE, CAPILLARY - Abnormal; Notable for the following:    Glucose-Capillary 483 (*)    All other components within normal limits  COMPREHENSIVE METABOLIC PANEL - Abnormal; Notable for the following:    Sodium 130 (*)    Chloride 98 (*)    Glucose, Bld 475 (*)    All other components within normal limits  BLOOD GAS, VENOUS - Abnormal; Notable for the following:    pCO2, Ven 41 (*)    pO2, Ven 52.0 (*)    All other components within normal limits  CBC  URINALYSIS COMPLETEWITH MICROSCOPIC ([facility] ONLY)       RADIOLOGY DG Abd 1 View (Final result) Result time: 06/23/15 03:36:10   Final result by Rad Results In Interface (06/23/15 03:36:10)   Narrative:   CLINICAL DATA: 30 year old male with left lower quadrant abdominal pain  EXAM: ABDOMEN - 1 VIEW  COMPARISON: None.  FINDINGS: There is moderate stool throughout the colon. No evidence of bowel obstruction or free air. No radiopaque calculi. The visualized osseous structures and soft tissues appear unremarkable.  IMPRESSION: Constipation. No bowel obstruction.   Electronically Signed By: Anner Crete M.D. On: 06/23/2015 03:36         INITIAL IMPRESSION / ASSESSMENT AND PLAN / ED COURSE  Pertinent labs & imaging results that were available during my care of the patient were reviewed by me and considered in my medical decision making (see chart for details). Patient received 3 L IV normal saline Patient received insulin IV 2 initial dose of 6 units with repeat glucose 341. Patient then received insulin 8  units  ____________________________________________   FINAL CLINICAL IMPRESSION(S) / ED DIAGNOSES  Final diagnoses:  New onset type 2 diabetes mellitus ([place])      Gregor Hams, MD 06/23/15 [phone]

## 2016-09-27 ENCOUNTER — Encounter (INDEPENDENT_AMBULATORY_CARE_PROVIDER_SITE_OTHER): Payer: Self-pay | Admitting: Vascular Surgery

## 2016-10-08 ENCOUNTER — Encounter (INDEPENDENT_AMBULATORY_CARE_PROVIDER_SITE_OTHER): Payer: Self-pay | Admitting: Vascular Surgery

## 2016-10-08 ENCOUNTER — Ambulatory Visit (INDEPENDENT_AMBULATORY_CARE_PROVIDER_SITE_OTHER): Payer: BLUE CROSS/BLUE SHIELD | Admitting: Vascular Surgery

## 2016-10-08 VITALS — BP 138/78 | HR 71 | Resp 16 | Ht 69.0 in | Wt 260.0 lb

## 2016-10-08 DIAGNOSIS — M7989 Other specified soft tissue disorders: Secondary | ICD-10-CM | POA: Diagnosis not present

## 2016-10-08 DIAGNOSIS — E119 Type 2 diabetes mellitus without complications: Secondary | ICD-10-CM | POA: Diagnosis not present

## 2016-10-08 DIAGNOSIS — M79604 Pain in right leg: Secondary | ICD-10-CM

## 2016-10-08 DIAGNOSIS — M79609 Pain in unspecified limb: Secondary | ICD-10-CM | POA: Insufficient documentation

## 2016-10-08 NOTE — Progress Notes (Signed)
Patient ID: Zachary Alexander, male   DOB: 10/19/1985, 31 y.o.   MRN: [id number]  Chief Complaint  Patient presents with  . New Evaluation    Left leg pain and burning    HPI Zachary Alexander is a 31 y.o. male.  Patient present for evaluation of Pain in the right lower lateral leg. This started several weeks ago without a clear inciting event or causative factor. It has gotten a little bit better over the past week without any specific therapy. He had a trauma about 15 years ago when he was a teenager and this had a patch of varicose veins in that area since that time. He is also noticed some dark spots on the lower leg just above the ankle. In addition, he has noticed some swelling in both lower extremities over the past few months. This comes and goes and has seemed to get worse with the warmer weather. He denies any fever or chills. He has no ulceration or infection. He was diagnosed with diabetes last year.  Current Outpatient Prescriptions  Medication Sig Dispense Refill  . glimepiride (AMARYL) 2 MG tablet Take by mouth.    . sertraline (ZOLOFT) 50 MG tablet Take by mouth.    Marland Kitchen glipiZIDE (GLUCOTROL) 5 MG tablet Take 1 tablet (5 mg total) by mouth daily before breakfast. 30 tablet 0  . metFORMIN (GLUCOPHAGE) 500 MG tablet Take 1 tablet (500 mg total) by mouth 2 (two) times daily with a meal. 60 tablet 2   No current facility-administered medications for this visit.      Past Medical History:  Diagnosis Date  . Diabetes mellitus without complication [facility])     Past Surgical History:  Procedure Laterality Date  . CYST REMOVAL NECK    . TONSILLECTOMY      Family History No bleeding disorders, clotting disorders, autoimmune diseases, or aneurysms  Social History Social History  Substance Use Topics  . Smoking status: Never Smoker  . Smokeless tobacco: Never Used  . Alcohol use Yes     Comment: rare  No IV drug use  Allergies  Allergen Reactions  . Penicillins Rash and  Other (See Comments)    Unknown... Childhood reaction  . Codeine Other (See Comments)    Childhood reaction  . Amoxicillin Rash        REVIEW OF SYSTEMS (Negative unless checked)  Constitutional: [] Weight loss  [] Fever  [] Chills Cardiac: [] Chest pain   [] Chest pressure   [] Palpitations   [] Shortness of breath when laying flat   [] Shortness of breath at rest   [] Shortness of breath with exertion. Vascular:  [] Pain in legs with walking   [x] Pain in legs at rest   [] Pain in legs when laying flat   [] Claudication   [] Pain in feet when walking  [] Pain in feet at rest  [] Pain in feet when laying flat   [] History of DVT   [] Phlebitis   [x] Swelling in legs   [] Varicose veins   [] Non-healing ulcers Pulmonary:   [] Uses home oxygen   [] Productive cough   [] Hemoptysis   [] Wheeze  [] COPD   [] Asthma Neurologic:  [] Dizziness  [] Blackouts   [] Seizures   [] History of stroke   [] History of TIA  [] Aphasia   [] Temporary blindness   [] Dysphagia   [] Weakness or numbness in arms   [] Weakness or numbness in legs Musculoskeletal:  [] Arthritis   [] Joint swelling   [] Joint pain   [] Low back pain Hematologic:  [] Easy bruising  [] Easy bleeding   []   Hypercoagulable state   [] Anemic  [] Hepatitis  Gastrointestinal:  [] Blood in stool   [] Vomiting blood  [] Gastroesophageal reflux/heartburn   [] Difficulty swallowing   Genitourinary:  [] Chronic kidney disease   [] Difficult urination  [] Frequent urination  [] Burning with urination   [] Blood in urine Skin:  [] Rashes   [] Ulcers   [] Wounds Psychological:  [] History of anxiety   []  History of major depression.  Physical Exam BP 138/78 (BP Location: Right Arm)   Pulse 71   Resp 16   Ht 5\' 9"  (1.753 m)   Wt 260 lb (117.9 kg)   BMI 38.40 kg/m   Gen:  WD/WN, NAD Head: [place]/AT, No temporalis wasting.  Ear/Nose/Throat: Hearing grossly intact, nares w/o erythema or drainage, oropharynx w/o Erythema/Exudate Eyes: Sclera non-icteric, conjunctiva clear Neck: Trachea midline.  No  JVD.  Pulmonary:  Good air movement, no use of accessory muscles.  Cardiac: RRR, normal S1, S2. Vascular:  Vessel Right Left  Radial Palpable Palpable  Ulnar Palpable Palpable  Brachial Palpable Palpable  Carotid Palpable, without bruit Palpable, without bruit  Aorta Not palpable N/A  Femoral Palpable Palpable  Popliteal Palpable Palpable  PT Palpable Palpable  DP Palpable Palpable   Gastrointestinal: soft, non-tender/non-distended.  Musculoskeletal: M/S 5/5 throughout.  Extremities without ischemic changes.  No deformity or atrophy. Trace bilateral lower extremity edema. Varicosities scattered but there is a patch and the right lower lateral leg that correlates with his area of discomfort.  Neurologic:  Sensation grossly intact in extremities.  Symmetrical.  Speech is fluent. Motor exam as listed above. Psychiatric: Judgment intact, Mood & affect appropriate for pt's clinical situation. Dermatologic: No rashes or ulcers noted.  No cellulitis or open wounds.     Radiology No results found.  Labs No results found for this or any previous visit (from the past 2160 hour(s)).  Assessment/Plan:  Swelling of limb Cause is not entirely clear, but certainly venous insufficiency could be contributing. Discussed the use of compression stockings, leg elevation, and exercise. Return after his venous duplex.  Diabetes mellitus without complication (HCC) blood glucose control important in reducing the progression of atherosclerotic disease. Also, involved in wound healing. On appropriate medications. Certainly some of his symptoms could represent some degree of neuropathy as the pain does sound neuropathic in nature. He is on 3 different medications for glucose control and says his control has gotten much better.  Pain in limb The cause of his lower extremity symptoms are not entirely clear. He has good pulses and it does not sound like arterial disease. I would have some concern of venous  disease particularly with the swelling in the lower extremities as well. Neuropathic pain could also be contributing. We are going to check a venous reflux study in the near future at his convenience. Compression stockings, elevation, and anti-inflammatories as needed for pain.     Festus BarrenJason Dew 10/08/2016, 9:13 AM   This note was created with [facility] medical dictation system.  Any errors from dictation are unintentional.

## 2016-10-08 NOTE — Assessment & Plan Note (Signed)
blood glucose control important in reducing the progression of atherosclerotic disease. Also, involved in wound healing. On appropriate medications. Certainly some of his symptoms could represent some degree of neuropathy as the pain does sound neuropathic in nature. He is on 3 different medications for glucose control and says his control has gotten much better.

## 2016-10-08 NOTE — Assessment & Plan Note (Signed)
The cause of his lower extremity symptoms are not entirely clear. He has good pulses and it does not sound like arterial disease. I would have some concern of venous disease particularly with the swelling in the lower extremities as well. Neuropathic pain could also be contributing. We are going to check a venous reflux study in the near future at his convenience. Compression stockings, elevation, and anti-inflammatories as needed for pain.

## 2016-10-08 NOTE — Patient Instructions (Signed)

## 2016-10-08 NOTE — Assessment & Plan Note (Signed)
Cause is not entirely clear, but certainly venous insufficiency could be contributing. Discussed the use of compression stockings, leg elevation, and exercise. Return after his venous duplex.

## 2016-10-18 ENCOUNTER — Encounter (INDEPENDENT_AMBULATORY_CARE_PROVIDER_SITE_OTHER): Payer: BLUE CROSS/BLUE SHIELD

## 2016-10-18 ENCOUNTER — Ambulatory Visit (INDEPENDENT_AMBULATORY_CARE_PROVIDER_SITE_OTHER): Payer: BLUE CROSS/BLUE SHIELD | Admitting: Vascular Surgery

## 2016-10-29 ENCOUNTER — Ambulatory Visit (INDEPENDENT_AMBULATORY_CARE_PROVIDER_SITE_OTHER): Payer: BLUE CROSS/BLUE SHIELD | Admitting: Vascular Surgery

## 2016-10-29 ENCOUNTER — Other Ambulatory Visit (INDEPENDENT_AMBULATORY_CARE_PROVIDER_SITE_OTHER): Payer: BLUE CROSS/BLUE SHIELD

## 2016-10-29 ENCOUNTER — Encounter (INDEPENDENT_AMBULATORY_CARE_PROVIDER_SITE_OTHER): Payer: Self-pay | Admitting: Vascular Surgery

## 2016-10-29 VITALS — BP 137/91 | HR 97 | Resp 17 | Wt 262.0 lb

## 2016-10-29 DIAGNOSIS — M79604 Pain in right leg: Secondary | ICD-10-CM | POA: Diagnosis not present

## 2016-10-29 DIAGNOSIS — M7989 Other specified soft tissue disorders: Secondary | ICD-10-CM

## 2016-10-29 DIAGNOSIS — E119 Type 2 diabetes mellitus without complications: Secondary | ICD-10-CM | POA: Diagnosis not present

## 2016-10-29 DIAGNOSIS — I83813 Varicose veins of bilateral lower extremities with pain: Secondary | ICD-10-CM

## 2016-10-29 NOTE — Assessment & Plan Note (Signed)
Venous disease primary cause

## 2016-10-29 NOTE — Patient Instructions (Signed)
Endovenous Ablation Endovenous ablation is a procedure that seals off an abnormally enlarged leg vein (varicose vein). This procedure uses heat from radiofrequency waves or a laser to close off the affected vein. This procedure may be done if the vein is causing pain, swelling, sores on the skin (ulcers), or skin discoloration. Tell a health care provider about:  Any allergies you have.  All medicines you are taking, including vitamins, herbs, eye drops, creams, and over-the-counter medicines.  Any problems you or family members have had with anesthetic medicines.  Any blood disorders you have.  Any surgeries you have had.  Any medical conditions you have.  Whether you are pregnant or may be pregnant. What are the risks? Generally, this is a safe procedure. However, problems may occur, including:  Infection.  Bleeding.  Allergic reactions to medicines.  Damage to other structures.  Numbness or tingling along the leg. This is uncommon, and it is usually temporary.  Vein swelling. This is usually temporary.  Blood clots that form in a deep vein of the leg (deep vein thrombosis, or DVT) and can travel to the lungs (pulmonary embolism, or PE). This is very rare.  What happens before the procedure?  You may have blood tests to make sure that your blood can clot normally.  Ask your health care provider about: ? Changing or stopping your regular medicines. This is especially important if you are taking diabetes medicines or blood thinners. ? Taking medicines such as aspirin and ibuprofen. These medicines can thin your blood. Do not take these medicines before your procedure if your health care provider instructs you not to.  Follow instructions from your health care provider about eating or drinking restrictions.  Plan to have someone take you home after the procedure. What happens during the procedure?  You will lie on an exam table.  To reduce your risk of  infection: ? Your health care team will wash or sanitize their hands. ? Your skin will be washed with soap.  Hair may be removed from the surgical area.  Your health care provider will use an imaging tool that uses sound waves (ultrasonogram) to show images of your leg veins.  You will be given medicine to numb the area (local anesthetic).  A small cut (incision) will be made near the area that will be treated. A narrow tube (catheter) will be slipped through the incision and into the vein.  Small sensors (electrodes or laser fibers) will be passed through the catheter and into the vein.  Radiofrequency or laser energy will be sent through the sensors to burn the vein. This seals off the vein.  The electrodes, laser fibers, and catheter will be removed from the vein.  A bandage (dressing) will be placed over the incision. The procedure may vary among health care providers and hospitals. What happens after the procedure?  You may have to wear compression stockings. These stockings help to prevent blood clots and reduce swelling in your legs.  You will be encouraged to walk around immediately after the procedure. This information is not intended to replace advice given to you by your health care provider. Make sure you discuss any questions you have with your health care provider. Document Released: 02/28/2011 Document Revised: 08/17/2015 Document Reviewed: 06/14/2014 Elsevier Interactive Patient Education  2018 [employer]  

## 2016-10-29 NOTE — Assessment & Plan Note (Signed)
See below

## 2016-10-29 NOTE — Progress Notes (Signed)
Patient ID: Zachary Alexander, male   DOB: [birth year], 31 y.o.   MRN: [id number]  Chief Complaint  Patient presents with  . ultrasound follow up    HPI Zachary Alexander is a 31 y.o. male.  Patient returns in follow up of their venous disease.  They have done their best to comply with the prescribed conservative therapies of compression stockings, leg elevation, exercise, and still requires anti-inflammatories for discomfort and has symptoms that are persistent and bothersome on a daily basis, affecting their activities of daily living and normal activities.  The venous reflux study demonstrates bilateral GSV reflux.     Current Outpatient Prescriptions  Medication Sig Dispense Refill  . glimepiride (AMARYL) 2 MG tablet Take by mouth.    . sertraline (ZOLOFT) 50 MG tablet Take by mouth.    Marland Kitchen glipiZIDE (GLUCOTROL) 5 MG tablet Take 1 tablet (5 mg total) by mouth daily before breakfast. 30 tablet 0  . metFORMIN (GLUCOPHAGE) 500 MG tablet Take 1 tablet (500 mg total) by mouth 2 (two) times daily with a meal. 60 tablet 2   No current facility-administered medications for this visit.          Past Medical History:  Diagnosis Date  . Diabetes mellitus without complication [facility])          Past Surgical History:  Procedure Laterality Date  . CYST REMOVAL NECK    . TONSILLECTOMY      Family History No bleeding disorders, clotting disorders, autoimmune diseases, or aneurysms  Social History        Social History   Substance Use Topics   . Smoking status: Never Smoker   . Smokeless tobacco: Never Used   . Alcohol use Yes     Comment: rare   No IV drug use       Allergies  Allergen Reactions  . Penicillins Rash and Other (See Comments)    Unknown... Childhood reaction  . Codeine Other (See Comments)    Childhood reaction  . Amoxicillin Rash        REVIEW OF SYSTEMS (Negative unless checked)  Constitutional: [] Weight loss  [] Fever   [] Chills Cardiac: [] Chest pain   [] Chest pressure   [] Palpitations   [] Shortness of breath when laying flat   [] Shortness of breath at rest   [] Shortness of breath with exertion. Vascular:  [] Pain in legs with walking   [x] Pain in legs at rest   [] Pain in legs when laying flat   [] Claudication   [] Pain in feet when walking  [] Pain in feet at rest  [] Pain in feet when laying flat   [] History of DVT   [] Phlebitis   [x] Swelling in legs   [x] Varicose veins   [] Non-healing ulcers Pulmonary:   [] Uses home oxygen   [] Productive cough   [] Hemoptysis   [] Wheeze  [] COPD   [] Asthma Neurologic:  [] Dizziness  [] Blackouts   [] Seizures   [] History of stroke   [] History of TIA  [] Aphasia   [] Temporary blindness   [] Dysphagia   [] Weakness or numbness in arms   [] Weakness or numbness in legs Musculoskeletal:  [] Arthritis   [] Joint swelling   [] Joint pain   [] Low back pain Hematologic:  [] Easy bruising  [] Easy bleeding   [] Hypercoagulable state   [] Anemic  [] Hepatitis  Gastrointestinal:  [] Blood in stool   [] Vomiting blood  [] Gastroesophageal reflux/heartburn   [] Difficulty swallowing   Genitourinary:  [] Chronic kidney disease   [] Difficult urination  [] Frequent urination  [] Burning with urination   []   Blood in urine Skin:  [] Rashes   [] Ulcers   [] Wounds Psychological:  [] History of anxiety   []  History of major depression.    Physical Exam BP (!) 137/91   Pulse 97   Resp 17   Wt 262 lb (118.8 kg)   BMI 38.69 kg/m  Gen:  WD/WN, NAD Head: [place]/AT, No temporalis wasting.  Ear/Nose/Throat: Hearing grossly intact, dentition good Eyes: Sclera non-icteric. Conjunctiva clear Neck: Supple. Trachea midline Pulmonary:  Good air movement, no use of accessory muscles, respirations not labored.  Cardiac: RRR, No JVD Vascular: Varicosities scattered and measuring up to 2 mm in the right lower extremity        Varicosities scattered and measuring up to 1-2 mm in the left lower extremity Vessel Right Left  Radial Palpable  Palpable  Ulnar Palpable Palpable  Brachial Palpable Palpable  Carotid Palpable, without bruit Palpable, without bruit  Aorta Not palpable N/A  Femoral Palpable Palpable  Popliteal Palpable Palpable  PT Palpable Palpable  DP Palpable Palpable    Musculoskeletal: M/S 5/5 throughout.   Trace BLE edema Neurologic: Sensation grossly intact in extremities.  Symmetrical.  Speech is fluent.  Psychiatric: Judgment intact, Mood & affect appropriate for pt's clinical situation. Dermatologic: No rashes or ulcers noted.  No cellulitis or open wounds.    Radiology No results found.  Labs No results found for this or any previous visit (from the past 2160 hour(s)).  Assessment/Plan: Diabetes mellitus without complication (HCC) blood glucose control important in reducing the progression of atherosclerotic disease. Also, involved in wound healing. On appropriate medications. Certainly some of his symptoms could represent some degree of neuropathy as the pain does sound neuropathic in nature. He is on 3 different medications for glucose control and says his control has gotten much better.  Swelling of limb Venous disease primary cause  Varicose veins of leg with pain, bilateral See below   The patient has done their best to comply with conservative therapy of 20-30 mm Hg compression stockings, leg elevation, exercise, and anti-inflammatories as needed for discomfort.  Despite this, they continue to have daily and persistent symptoms from their venous disease.  A venous reflux study demonstrates bilateral GSV reflux.  As such, the patient is likely to benefit from endovenous laser ablation of the both GSV in a staged fashion.  Risks and benefits of the procedure including bleeding, infection, recanalization, DVT, and need for further therapy for residual varicosities were discussed.  The patient voices their understanding and is agreeable to proceed with staged bilateral GSV EVLT.     Zachary BarrenJason  Alexander 10/29/2016, 5:42 PM

## 2017-01-10 ENCOUNTER — Encounter (INDEPENDENT_AMBULATORY_CARE_PROVIDER_SITE_OTHER): Payer: Self-pay | Admitting: Vascular Surgery

## 2017-01-10 ENCOUNTER — Ambulatory Visit (INDEPENDENT_AMBULATORY_CARE_PROVIDER_SITE_OTHER): Payer: BLUE CROSS/BLUE SHIELD | Admitting: Vascular Surgery

## 2017-01-10 VITALS — BP 136/87 | HR 98 | Resp 17 | Ht 69.5 in | Wt 259.0 lb

## 2017-01-10 DIAGNOSIS — E119 Type 2 diabetes mellitus without complications: Secondary | ICD-10-CM | POA: Diagnosis not present

## 2017-01-10 DIAGNOSIS — M7989 Other specified soft tissue disorders: Secondary | ICD-10-CM | POA: Diagnosis not present

## 2017-01-10 DIAGNOSIS — I83813 Varicose veins of bilateral lower extremities with pain: Secondary | ICD-10-CM

## 2017-01-10 NOTE — Patient Instructions (Signed)
Varicose Vein Surgery, Care After Refer to this sheet in the next few weeks. These instructions provide you with information about caring for yourself after your procedure. Your health care provider may also give you more specific instructions. Your treatment has been planned according to current medical practices, but problems sometimes occur. Call your health care provider if you have any problems or questions after your procedure. What can I expect after the procedure? After your procedure, it is typical to have the following:  Swelling.  Bruising.  Soreness.  Mild skin discoloration.  Slight bleeding at incision sites.  Follow these instructions at home:  Take medicines only as directed by your health care provider.  Wear compression stockings as directed by your health care provider. These stockings help to prevent blood clots and reduce swelling in your legs.  There are many different ways to close and cover an incision, including stitches (sutures), skin glue, and adhesive strips. Follow your health care provider's instructions on: ? Incision care. ? Bandage (dressing) changes and removal. ? Incision closure removal.  Wear loose-fitting clothing.  Get regular daily exercise. Walk or ride a stationary bike daily or as directed by your health care provider.  Ask your health care provider when you can return to work. This may depend on the type of work you do.  Be patient with your recovery. It can take up to 4 weeks to get back to your usual activities. Contact a health care provider if:  You have a fever.  You have drainage, redness, swelling, or pain at an incision site.  You develop a cough. Get help right away if:  You pass out.  You have very bad pain in your leg.  You have leg pain that gets worse when you walk.  You have redness or swelling in your leg that is getting worse.  You have trouble breathing.  You cough up blood. This information is not  intended to replace advice given to you by your health care provider. Make sure you discuss any questions you have with your health care provider. Document Released: 11/12/2013 Document Revised: 08/17/2015 Document Reviewed: 08/18/2013 Elsevier Interactive Patient Education  2018 Elsevier Inc.  

## 2017-01-10 NOTE — Progress Notes (Signed)
Patient ID: Zachary Alexander, male   DOB: [birth year], 31 y.o.   MRN: [id number]  Chief Complaint  Patient presents with  . Follow-up    3 month laser documnetation    HPI Zachary Alexander is a 31 y.o. male.  Patient returns in follow up of their venous disease.  They have done their best to comply with the prescribed conservative therapies of compression stockings, leg elevation, exercise for over 3 months, and still requires anti-inflammatories for discomfort and has symptoms that are persistent and bothersome on a daily basis, affecting their activities of daily living and normal activities.  The venous reflux study demonstrates bilateral great saphenous vein reflux with no evidence of DVT or superficial thrombophlebitis.    Current Outpatient Prescriptions  Medication Sig Dispense Refill  . glimepiride (AMARYL) 2 MG tablet Take by mouth.    . sertraline (ZOLOFT) 50 MG tablet Take by mouth.    Marland Kitchen glipiZIDE (GLUCOTROL) 5 MG tablet Take 1 tablet (5 mg total) by mouth daily before breakfast. 30 tablet 0  . metFORMIN (GLUCOPHAGE) 500 MG tablet Take 1 tablet (500 mg total) by mouth 2 (two) times daily with a meal. 60 tablet 2   No current facility-administered medications for this visit.          Past Medical History:  Diagnosis Date  . Diabetes mellitus without complication [facility])          Past Surgical History:  Procedure Laterality Date  . CYST REMOVAL NECK    . TONSILLECTOMY      Family History No bleeding disorders, clotting disorders, autoimmune diseases, or aneurysms  Social History        Social History   Substance Use Topics   . Smoking status: Never Smoker   . Smokeless tobacco: Never Used   . Alcohol use Yes     Comment: rare   No IV drug use       Allergies  Allergen Reactions  . Penicillins Rash and Other (See Comments)    Unknown... Childhood reaction  . Codeine Other (See Comments)    Childhood reaction  . Amoxicillin  Rash        REVIEW OF SYSTEMS (Negative unless checked)  Constitutional: [] Weight loss  [] Fever  [] Chills Cardiac: [] Chest pain   [] Chest pressure   [] Palpitations   [] Shortness of breath when laying flat   [] Shortness of breath at rest   [] Shortness of breath with exertion. Vascular:  [] Pain in legs with walking   [x] Pain in legs at rest   [] Pain in legs when laying flat   [] Claudication   [] Pain in feet when walking  [] Pain in feet at rest  [] Pain in feet when laying flat   [] History of DVT   [] Phlebitis   [x] Swelling in legs   [x] Varicose veins   [] Non-healing ulcers Pulmonary:   [] Uses home oxygen   [] Productive cough   [] Hemoptysis   [] Wheeze  [] COPD   [] Asthma Neurologic:  [] Dizziness  [] Blackouts   [] Seizures   [] History of stroke   [] History of TIA  [] Aphasia   [] Temporary blindness   [] Dysphagia   [] Weakness or numbness in arms   [] Weakness or numbness in legs Musculoskeletal:  [] Arthritis   [] Joint swelling   [] Joint pain   [] Low back pain Hematologic:  [] Easy bruising  [] Easy bleeding   [] Hypercoagulable state   [] Anemic  [] Hepatitis  Gastrointestinal:  [] Blood in stool   [] Vomiting blood  [] Gastroesophageal reflux/heartburn   [] Difficulty swallowing  Genitourinary:  [] Chronic kidney disease   [] Difficult urination  [] Frequent urination  [] Burning with urination   [] Blood in urine Skin:  [] Rashes   [] Ulcers   [] Wounds Psychological:  [] History of anxiety   []  History of major depression.     Physical Exam BP 136/87 (BP Location: Right Arm)   Pulse 98   Resp 17   Ht 5' 9.5" (1.765 m)   Wt 117.5 kg (259 lb)   BMI 37.70 kg/m  Gen:  WD/WN, NAD Head: [place]/AT, No temporalis wasting.  Ear/Nose/Throat: Hearing grossly intact, dentition good Eyes: Sclera non-icteric. Conjunctiva clear Neck: Supple. Trachea midline Pulmonary:  Good air movement, no use of accessory muscles, respirations not labored.  Cardiac: RRR, No JVD Vascular: Varicosities diffuse and measuring up to  2-3 mm in the right lower extremity        Varicosities diffuse and measuring up to 2-3 mm in the left lower extremity Vessel Right Left  Radial Palpable Palpable  Ulnar Palpable Palpable  Brachial Palpable Palpable  Carotid Palpable, without bruit Palpable, without bruit  Aorta Not palpable N/A  Femoral Palpable Palpable  Popliteal Palpable Palpable  PT Palpable Palpable  DP Palpable Palpable   Gastrointestinal: soft, non-tender/non-distended. No guarding/reflex. No masses, surgical incisions, or scars. Musculoskeletal: M/S 5/5 throughout.   1 + RLE edema.  1 + LLE edema Neurologic: Sensation grossly intact in extremities.  Symmetrical.  Speech is fluent.  Psychiatric: Judgment intact, Mood & affect appropriate for pt's clinical situation. Dermatologic: No rashes or ulcers noted.  No cellulitis or open wounds. Lymph : No Cervical, Axillary, or Inguinal lymphadenopathy.   Radiology No results found.  Labs No results found for this or any previous visit (from the past 2160 hour(s)).  Assessment/Plan: Swelling of limb Cause is not entirely clear, but certainly venous insufficiency could be contributing. Discussed the use of compression stockings, leg elevation, and exercise. Venous plan as below  Diabetes mellitus without complication (HCC) blood glucose control important in reducing the progression of atherosclerotic disease. Also, involved in wound healing. On appropriate medications. Certainly some of his symptoms could represent some degree of neuropathy as the pain does sound neuropathic in nature. He is on 3 different medications for glucose control and says his control has gotten much better.  Varicose veins of leg with pain, bilateral   The patient has done their best to comply with conservative therapy of 20-30 mm Hg compression stockings, leg elevation, exercise, and anti-inflammatories as needed for discomfort.  Despite this, they continue to have daily and persistent  symptoms from their venous disease.  A venous reflux study demonstrates bilateral great saphenous vein reflux.  As such, the patient is likely to benefit from endovenous laser ablation of the great saphenous veins in a staged fashion.  Risks and benefits of the procedure including bleeding, infection, recanalization, DVT, and need for further therapy for residual varicosities were discussed.  The patient voices their understanding and is agreeable to proceed with bilateral great saphenous vein ablation in a staged fashion.     Festus BarrenJason Dew 01/10/2017, 10:09 AM

## 2019-06-11 ENCOUNTER — Other Ambulatory Visit: Payer: Self-pay

## 2019-06-11 DIAGNOSIS — Z7984 Long term (current) use of oral hypoglycemic drugs: Secondary | ICD-10-CM | POA: Insufficient documentation

## 2019-06-11 DIAGNOSIS — Z98818 Other dental procedure status: Secondary | ICD-10-CM | POA: Diagnosis not present

## 2019-06-11 DIAGNOSIS — R609 Edema, unspecified: Secondary | ICD-10-CM | POA: Diagnosis not present

## 2019-06-11 DIAGNOSIS — E119 Type 2 diabetes mellitus without complications: Secondary | ICD-10-CM | POA: Diagnosis not present

## 2019-06-11 DIAGNOSIS — R2243 Localized swelling, mass and lump, lower limb, bilateral: Secondary | ICD-10-CM | POA: Diagnosis present

## 2019-06-11 DIAGNOSIS — M542 Cervicalgia: Secondary | ICD-10-CM | POA: Insufficient documentation

## 2019-06-11 LAB — BASIC METABOLIC PANEL
Anion gap: 6 (ref 5–15)
BUN: 18 mg/dL (ref 6–20)
CO2: 30 mmol/L (ref 22–32)
Calcium: 9.7 mg/dL (ref 8.9–10.3)
Chloride: 103 mmol/L (ref 98–111)
Creatinine, Ser: 1.11 mg/dL (ref 0.61–1.24)
GFR calc Af Amer: >60 mL/min (ref >60)
GFR calc non Af Amer: >60 mL/min (ref >60)
Glucose, Bld: 106 mg/dL — ABNORMAL HIGH (ref 70–99)
Potassium: 4.1 mmol/L (ref 3.5–5.1)
Sodium: 139 mmol/L (ref 135–145)

## 2019-06-11 LAB — CBC
HCT: 42.5 % (ref 39.0–52.0)
Hemoglobin: 14.1 g/dL (ref 13.0–17.0)
MCH: 32.1 pg (ref 26.0–34.0)
MCHC: 33.2 g/dL (ref 30.0–36.0)
MCV: 96.8 fL (ref 80.0–100.0)
Platelets: 337 10*3/uL (ref 150–400)
RBC: 4.39 MIL/uL (ref 4.22–5.81)
RDW: 12.1 % (ref 11.5–15.5)
WBC: 8.1 10*3/uL (ref 4.0–10.5)
nRBC: 0 % (ref 0.0–0.2)

## 2019-06-11 MED ORDER — IBUPROFEN 400 MG PO TABS
400.0000 mg | ORAL_TABLET | Freq: Once | ORAL | Status: AC | PRN
  Administered 2019-06-11: 400 mg via ORAL

## 2019-06-11 NOTE — ED Triage Notes (Addendum)
Pt comes via POV from home with c/o bilateral leg swelling the patient states he noticed it a few days. Pt states abdominal swelling too.  Pt states he noticed it more yesterday. Pt also states some soreness to neck.  Pt states he recently had wisdom teeth removed and has been taking medication with no relief.  Pt states headache all day. Pt states his legs felt like sandbags

## 2019-06-12 ENCOUNTER — Emergency Department
Admission: EM | Admit: 2019-06-12 | Discharge: 2019-06-12 | Disposition: A | Discharge status: Home / Self Care | Payer: BC Managed Care – PPO | Attending: Emergency Medicine | Admitting: Emergency Medicine

## 2019-06-12 ENCOUNTER — Emergency Department: Payer: BC Managed Care – PPO

## 2019-06-12 DIAGNOSIS — R609 Edema, unspecified: Secondary | ICD-10-CM

## 2019-06-12 LAB — URINALYSIS, ROUTINE W REFLEX MICROSCOPIC
Bilirubin Urine: NEGATIVE
Glucose, UA: NEGATIVE mg/dL
Hgb urine dipstick: NEGATIVE
Ketones, ur: NEGATIVE mg/dL
Leukocytes,Ua: NEGATIVE
Nitrite: NEGATIVE
Protein, ur: NEGATIVE mg/dL
Specific Gravity, Urine: 1.021 (ref 1.005–1.030)
pH: 5 (ref 5.0–8.0)

## 2019-06-12 LAB — HEPATIC FUNCTION PANEL
ALT: 33 U/L (ref 0–44)
AST: 23 U/L (ref 15–41)
Albumin: 4.4 g/dL (ref 3.5–5.0)
Alkaline Phosphatase: 76 U/L (ref 38–126)
Bilirubin, Direct: <0.1 mg/dL (ref 0.0–0.2)
Total Bilirubin: 0.5 mg/dL (ref 0.3–1.2)
Total Protein: 7.4 g/dL (ref 6.5–8.1)

## 2019-06-12 LAB — TSH: TSH: 2.628 u[IU]/mL (ref 0.350–4.500)

## 2019-06-12 LAB — BRAIN NATRIURETIC PEPTIDE: B Natriuretic Peptide: 45 pg/mL (ref 0.0–100.0)

## 2019-06-12 LAB — T4, FREE: Free T4: 0.8 ng/dL (ref 0.61–1.12)

## 2019-06-12 MED ORDER — ACETAMINOPHEN 500 MG PO TABS
1000.0000 mg | ORAL_TABLET | Freq: Once | ORAL | Status: AC
  Administered 2019-06-12: 1000 mg via ORAL
  Filled 2019-06-12: qty 2

## 2019-06-12 MED ORDER — FUROSEMIDE 20 MG PO TABS: 20.0000 mg | ORAL_TABLET | Freq: Every day | ORAL | 0 refills | Status: DC

## 2019-06-12 NOTE — ED Provider Notes (Signed)
[facility] Emergency Department Provider Note  ____________________________________________   None    (approximate)  I have reviewed the triage vital signs and the nursing notes.   HISTORY  Chief Complaint Leg Swelling    HPI JEREMIE GIANGRANDE is a 34 y.o. male with diabetes who comes in with concern for leg swelling.  Patient states that he had his wisdom teeth taken out a few days ago.  He has been taking ibuprofen and occasional oxycodone to help with the pain.  He states that over the past 2 days he is noticed worsening leg swelling bilaterally.  May be slightly worse in the left leg.  He has some pitting edema.  His wife works in the ICU and wanted him to come to the ER to be evaluated.  This is been constant, nothing makes it better although slightly improved from yesterday, nothing makes it worse.  Denies having this previously.  Denies any chest pain or shortness of breath.  Patient does report a little bit of soreness to his neck but is able to range his neck fully.          Past Medical History:  Diagnosis Date  . Diabetes mellitus without complication [facility])     Patient Active Problem List   Diagnosis Date Noted  . Varicose veins of leg with pain, bilateral 10/29/2016  . Swelling of limb 10/08/2016  . Diabetes mellitus without complication (HCC) 10/08/2016  . Pain in limb 10/08/2016    Past Surgical History:  Procedure Laterality Date  . CYST REMOVAL NECK    . TONSILLECTOMY      Prior to Admission medications   Medication Sig Start Date End Date Taking? Authorizing Provider  glimepiride (AMARYL) 2 MG tablet Take by mouth. 06/17/16 06/17/17  [provider]  glipiZIDE (GLUCOTROL) 5 MG tablet Take 1 tablet (5 mg total) by mouth daily before breakfast. 06/23/15 07/23/15  Darci Current, MD  metFORMIN (GLUCOPHAGE) 500 MG tablet Take 1 tablet (500 mg total) by mouth 2 (two) times daily with a meal. 06/21/15 06/20/16  Phineas Semen, MD  sertraline (ZOLOFT) 50 MG tablet Take by mouth. 06/17/16 06/17/17  [provider]    Allergies Penicillins, Codeine, and Amoxicillin  Family History  Problem Relation Age of Onset  . Liver disease Father   . Diabetes Paternal Grandmother   . Heart attack Paternal Grandmother   . Diabetes Paternal Grandfather     Social History Social History   Tobacco Use  . Smoking status: Never Smoker  . Smokeless tobacco: Never Used  Substance Use Topics  . Alcohol use: Yes    Comment: rare  . Drug use: No      Review of Systems Constitutional: No fever/chills Eyes: No visual changes. ENT: No sore throat.  Neck discomfort. Cardiovascular: Denies chest pain.  Leg swelling Respiratory: Denies shortness of breath. Gastrointestinal: No abdominal pain.  No nausea, no vomiting.  No diarrhea.  No constipation. Genitourinary: Negative for dysuria. Musculoskeletal: Negative for back pain. Skin: Negative for rash. Neurological: Negative for headaches, focal weakness or numbness. All other ROS negative ____________________________________________   PHYSICAL EXAM:  VITAL SIGNS: ED Triage Vitals  Enc Vitals Group     BP 06/11/19 1900 (!) 169/101     Pulse Rate 06/11/19 1900 77     Resp 06/11/19 1900 18     Temp 06/11/19 1900 98 F (36.7 C)     Temp src --      SpO2  06/11/19 1900 99 %     Weight 06/11/19 1901 260 lb (117.9 kg)     Height 06/11/19 1901 5\' 10"  (1.778 m)     Head Circumference --      Peak Flow --      Pain Score 06/11/19 1901 4     Pain Loc --      Pain Edu? --      Excl. in GC? --     Constitutional: Alert and oriented. Well appearing and in no acute distress. Eyes: Conjunctivae are normal. EOMI. Head: Atraumatic. Nose: No congestion/rhinnorhea. Mouth/Throat: Mucous membranes are moist.  OP clear Neck: No stridor. Trachea Midline. FROM, no masses felt Cardiovascular: Normal rate, regular rhythm. Grossly normal heart sounds.  Good  peripheral circulation.  1+ pitting edema bilaterally Respiratory: Normal respiratory effort.  No retractions. Lungs CTAB. Gastrointestinal: Soft and nontender. No distention. No abdominal bruits.  Musculoskeletal: 1+ pitting edema bilaterally no joint effusions.  2+ distal pulses on his feet Neurologic:  Normal speech and language. No gross focal neurologic deficits are appreciated.  Skin:  Skin is warm, dry and intact. No rash noted. Psychiatric: Mood and affect are normal. Speech and behavior are normal. GU: Deferred   ____________________________________________   LABS (all labs ordered are listed, but only abnormal results are displayed)  Labs Reviewed  BASIC METABOLIC PANEL - Abnormal; Notable for the following components:      Result Value   Glucose, Bld 106 (*)    All other components within normal limits  URINALYSIS, ROUTINE W REFLEX MICROSCOPIC - Abnormal; Notable for the following components:   Color, Urine YELLOW (*)    APPearance CLEAR (*)    All other components within normal limits  CBC  BRAIN NATRIURETIC PEPTIDE  HEPATIC FUNCTION PANEL  TSH  T4, FREE   ____________________________________________   ED ECG REPORT I, 06/13/19, the attending physician, personally viewed and interpreted this ECG.  EKG is sinus bradycardia rate of 56, no ST elevations, no T wave inversions, normal intervals ____________________________________________  RADIOLOGY   Official radiology report(s): Concha Se Venous Img Lower Bilateral  Result Date: 06/12/2019 CLINICAL DATA:  Bilateral lower extremity swelling for a few days. EXAM: BILATERAL LOWER EXTREMITY VENOUS DOPPLER ULTRASOUND TECHNIQUE: Gray-scale sonography with compression, as well as color and duplex ultrasound, were performed to evaluate the deep venous system(s) from the level of the common femoral vein through the popliteal and proximal calf veins. COMPARISON:  None. FINDINGS: VENOUS Normal compressibility of the common  femoral, superficial femoral, and popliteal veins, as well as the visualized calf veins. Visualized portions of profunda femoral vein and great saphenous vein unremarkable. No filling defects to suggest DVT on grayscale or color Doppler imaging. Doppler waveforms show normal direction of venous flow, normal respiratory phasicity and response to augmentation. Limited views of the contralateral common femoral vein are unremarkable. OTHER None. Limitations: none IMPRESSION: No femoropopliteal DVT nor evidence of DVT within the visualized calf veins. If clinical symptoms are inconsistent or if there are persistent or worsening symptoms, further imaging (possibly involving the iliac veins) may be warranted. Electronically Signed   By: 06/14/2019 III M.D   On: 06/12/2019 04:18    ____________________________________________   PROCEDURES  Procedure(s) performed (including Critical Care):  Procedures   ____________________________________________   INITIAL IMPRESSION / ASSESSMENT AND PLAN / ED COURSE  DINARI STGERMAINE was evaluated in Emergency Department on 06/12/2019 for the symptoms described in the history of present illness. He was evaluated in the  context of the global COVID-19 pandemic, which necessitated consideration that the patient might be at risk for infection with the SARS-CoV-2 virus that causes COVID-19. Institutional protocols and algorithms that pertain to the evaluation of patients at risk for COVID-19 are in a state of rapid change based on information released by regulatory bodies including the CDC and federal and state organizations. These policies and algorithms were followed during the patient's care in the ED.    Patient is a well-appearing 34 year old slightly hypertensive who comes in with bilateral leg swelling.  Patient does have pitting edema bilaterally.  Will get BNP to evaluate for CHF, CMP to evaluate for liver dysfunction, urine to evaluate for nephrotic syndrome,  DVT ultrasounds to evaluate for blood clots given recent surgery and slightly worsening swelling on the left.  For patient's sore neck he states is very minimal in nature and he is got full range of motion.  Low Suspicion for RPA.  Most likely just reactive lymph nodes from his tooth surgery.  OP is clear  Patient's labs are reassuring.  DVT ultrasounds are negative.  At this time I have low suspicion for iliac vein involvement given he really does not have any risk factors for blood clots and the swelling is minimal in nature  Discussed with patient that this could be due to venous insufficiency.  We discussed compression hoses, keeping legs elevated.  Given patient is also hypertensive we can trial a 3-day course of Lasix.  Patient will follow up with his primary care doctor next week for his high blood pressures and to see if the swelling has gotten any better.  Discussed that he may need echocardiogram in the future.  I discussed the provisional nature of ED diagnosis, the treatment so far, the ongoing plan of care, follow up appointments and return precautions with the patient and any family or support people present. They expressed understanding and agreed with the plan, discharged home.   ____________________________________________   FINAL CLINICAL IMPRESSION(S) / ED DIAGNOSES   Final diagnoses:  Peripheral edema      MEDICATIONS GIVEN DURING THIS VISIT:  Medications  ibuprofen (ADVIL) tablet 400 mg (400 mg Oral Given 06/11/19 1908)  acetaminophen (TYLENOL) tablet 1,000 mg (1,000 mg Oral Given 06/12/19 0240)     ED Discharge Orders         Ordered    furosemide (LASIX) 20 MG tablet  Daily     06/12/19 0504           Note:  This document was prepared using Dragon voice recognition software and may include unintentional dictation errors.   Vanessa Lake [place], MD 06/12/19 [phone]

## 2019-06-12 NOTE — Discharge Instructions (Addendum)
Your ultrasounds were negative and labs are reassuring.  We will trial some Lasix to see if that helps improve your symptoms.  Keep your legs up elevated and wear compression socks.  Follow-up with your primary care doctor next week.  If the swelling continues but you may need to have further imaging such as echocardiogram.  Return to the ER if you develop shortness of breath or any other concerns

## 2019-06-12 NOTE — ED Notes (Addendum)
Pt given UA cup - reports just urinated approx 15 min prior, pt given PB and graham crackers and drink per EDP consent  Pt awaiting [place]

## 2019-06-12 NOTE — ED Notes (Signed)
Pt to US att 

## 2019-06-12 NOTE — ED Notes (Signed)
Pt back from [place] att getting UA collection

## 2019-06-12 NOTE — ED Notes (Signed)
Reviewed discharge instructions, follow-up care, and prescriptions with patient. Patient verbalized understanding of all information reviewed. Patient stable, with no distress noted at this time.    

## 2020-02-07 ENCOUNTER — Other Ambulatory Visit (HOSPITAL_COMMUNITY): Payer: Self-pay | Admitting: Infectious Diseases

## 2020-02-07 ENCOUNTER — Other Ambulatory Visit: Payer: Self-pay | Admitting: Infectious Diseases

## 2020-02-07 DIAGNOSIS — G6289 Other specified polyneuropathies: Secondary | ICD-10-CM

## 2020-02-07 DIAGNOSIS — J0100 Acute maxillary sinusitis, unspecified: Secondary | ICD-10-CM

## 2020-02-07 DIAGNOSIS — E118 Type 2 diabetes mellitus with unspecified complications: Secondary | ICD-10-CM

## 2020-02-07 DIAGNOSIS — R748 Abnormal levels of other serum enzymes: Secondary | ICD-10-CM

## 2020-02-11 ENCOUNTER — Ambulatory Visit
Admission: RE | Admit: 2020-02-11 | Discharge: 2020-02-11 | Disposition: A | Discharge status: Home / Self Care | Payer: BC Managed Care – PPO | Source: Ambulatory Visit | Attending: Infectious Diseases | Admitting: Infectious Diseases

## 2020-02-11 ENCOUNTER — Other Ambulatory Visit: Payer: Self-pay

## 2020-02-11 DIAGNOSIS — G6289 Other specified polyneuropathies: Secondary | ICD-10-CM | POA: Insufficient documentation

## 2020-02-11 DIAGNOSIS — R748 Abnormal levels of other serum enzymes: Secondary | ICD-10-CM | POA: Diagnosis present

## 2020-02-11 DIAGNOSIS — J0100 Acute maxillary sinusitis, unspecified: Secondary | ICD-10-CM | POA: Diagnosis present

## 2020-02-11 DIAGNOSIS — E118 Type 2 diabetes mellitus with unspecified complications: Secondary | ICD-10-CM | POA: Insufficient documentation

## 2020-07-17 ENCOUNTER — Other Ambulatory Visit: Payer: Self-pay

## 2020-07-17 ENCOUNTER — Ambulatory Visit
Admission: EM | Admit: 2020-07-17 | Discharge: 2020-07-17 | Disposition: A | Discharge status: Home / Self Care | Payer: BC Managed Care – PPO

## 2020-07-17 DIAGNOSIS — H6691 Otitis media, unspecified, right ear: Secondary | ICD-10-CM

## 2020-07-17 DIAGNOSIS — J069 Acute upper respiratory infection, unspecified: Secondary | ICD-10-CM

## 2020-07-17 MED ORDER — AZITHROMYCIN 250 MG PO TABS: 250.0000 mg | ORAL_TABLET | Freq: Every day | ORAL | 0 refills | Status: DC

## 2020-07-17 MED ORDER — BENZONATATE 100 MG PO CAPS: 100.0000 mg | ORAL_CAPSULE | Freq: Three times a day (TID) | ORAL | 0 refills | Status: DC | PRN

## 2020-07-17 NOTE — ED Triage Notes (Signed)
Paitent in office today c/o sorethroat,ear pain and cough (brownish mucus) sx started 4 days ago.   OTC: mucinex and nasal spray  Denied: fever, n/v

## 2020-07-17 NOTE — ED Provider Notes (Signed)
Zachary Alexander    CSN: [id number] Arrival date & time: 07/17/20  9509      History   Chief Complaint Chief Complaint  Patient presents with  . Sore Throat       . Otalgia    HPI Zachary Alexander is a 35 y.o. male.   Patient presents with 4-day history of sore throat, right ear pain, congestion, postnasal drip, productive cough.  He denies rash, shortness of breath, vomiting, diarrhea, or other symptoms.  Treatment attempted at home with OTC nasal spray and Mucinex.  Non-smoker.  His medical history includes diabetes.  The history is provided by the patient and medical records.    Past Medical History:  Diagnosis Date  . Diabetes mellitus without complication [facility])     Patient Active Problem List   Diagnosis Date Noted  . Varicose veins of leg with pain, bilateral 10/29/2016  . Swelling of limb 10/08/2016  . Diabetes mellitus without complication (HCC) 10/08/2016  . Pain in limb 10/08/2016    Past Surgical History:  Procedure Laterality Date  . CYST REMOVAL NECK    . TONSILLECTOMY         Home Medications    Prior to Admission medications   Medication Sig Start Date End Date Taking? Authorizing Provider  azithromycin (ZITHROMAX) 250 MG tablet Take 1 tablet (250 mg total) by mouth daily. Take first 2 tablets together, then 1 every day until finished. 07/17/20  Yes Mickie Bail, NP  benzonatate (TESSALON) 100 MG capsule Take 1 capsule (100 mg total) by mouth 3 (three) times daily as needed for cough. 07/17/20  Yes Mickie Bail, NP  busPIRone (BUSPAR) 5 MG tablet Take 1 tablet by mouth 2 (two) times daily. 06/29/20  Yes [provider]  furosemide (LASIX) 20 MG tablet Take 1 tablet (20 mg total) by mouth daily for 3 days. 06/12/19 06/15/19  Concha Se, MD  glimepiride (AMARYL) 2 MG tablet Take by mouth. 06/17/16 06/17/17  [provider]  glipiZIDE (GLUCOTROL) 5 MG tablet Take 1 tablet (5 mg total) by mouth daily before breakfast. 06/23/15  07/23/15  Darci Current, MD  metFORMIN (GLUCOPHAGE) 500 MG tablet Take 1 tablet (500 mg total) by mouth 2 (two) times daily with a meal. 06/21/15 06/20/16  Phineas Semen, MD  sertraline (ZOLOFT) 50 MG tablet Take by mouth. 06/17/16 06/17/17  [provider]    Family History Family History  Problem Relation Age of Onset  . Liver disease Father   . Diabetes Paternal Grandmother   . Heart attack Paternal Grandmother   . Diabetes Paternal Grandfather     Social History Social History   Tobacco Use  . Smoking status: Never Smoker  . Smokeless tobacco: Never Used  Substance Use Topics  . Alcohol use: Yes    Comment: rare  . Drug use: No     Allergies   Penicillins, Codeine, and Amoxicillin   Review of Systems Review of Systems  Constitutional: Negative for chills and fever.  HENT: Positive for congestion, ear pain, postnasal drip, rhinorrhea and sore throat.   Eyes: Negative for pain and visual disturbance.  Respiratory: Positive for cough. Negative for shortness of breath.   Cardiovascular: Negative for chest pain and palpitations.  Gastrointestinal: Negative for abdominal pain, diarrhea and vomiting.  Genitourinary: Negative for dysuria and hematuria.  Musculoskeletal: Negative for arthralgias and back pain.  Skin: Negative for color change and rash.  Neurological: Negative for seizures and syncope.  All  other systems reviewed and are negative.    Physical Exam Triage Vital Signs ED Triage Vitals  Enc Vitals Group     BP      Pulse      Resp      Temp      Temp src      SpO2      Weight      Height      Head Circumference      Peak Flow      Pain Score      Pain Loc      Pain Edu?      Excl. in GC?    No data found.  Updated Vital Signs BP 121/88   Pulse 92   Temp 98.6 F (37 C) (Oral)   Resp 18   Ht 5\' 10"  (1.778 m)   Wt 270 lb (122.5 kg)   SpO2 96%   BMI 38.74 kg/m   Visual Acuity Right Eye Distance:   Left Eye Distance:    Bilateral Distance:    Right Eye Near:   Left Eye Near:    Bilateral Near:     Physical Exam Vitals and nursing note reviewed.  Constitutional:      General: He is not in acute distress.    Appearance: He is well-developed. He is not ill-appearing.  HENT:     Head: Normocephalic and atraumatic.     Right Ear: Ear canal normal. Tympanic membrane is erythematous.     Left Ear: Tympanic membrane and ear canal normal. Tympanic membrane is not erythematous.     Nose: Rhinorrhea present.     Mouth/Throat:     Mouth: Mucous membranes are moist.     Pharynx: Oropharynx is clear.  Eyes:     Conjunctiva/sclera: Conjunctivae normal.  Cardiovascular:     Rate and Rhythm: Normal rate and regular rhythm.     Heart sounds: Normal heart sounds.  Pulmonary:     Effort: Pulmonary effort is normal. No respiratory distress.     Breath sounds: Normal breath sounds.     Comments: Nonproductive cough during exam. Abdominal:     Palpations: Abdomen is soft.     Tenderness: There is no abdominal tenderness.  Musculoskeletal:     Cervical back: Neck supple.  Skin:    General: Skin is warm and dry.     Findings: No rash.  Neurological:     General: No focal deficit present.     Mental Status: He is alert and oriented to person, place, and time.     Gait: Gait normal.  Psychiatric:        Mood and Affect: Mood normal.        Behavior: Behavior normal.      UC Treatments / Results  Labs (all labs ordered are listed, but only abnormal results are displayed) Labs Reviewed - No data to display  EKG   Radiology No results found.  Procedures Procedures (including critical care time)  Medications Ordered in UC Medications - No data to display  Initial Impression / Assessment and Plan / UC Course  I have reviewed the triage vital signs and the nursing notes.  Pertinent labs & imaging results that were available during my care of the patient were reviewed by me and considered in my  medical decision making (see chart for details).   Right otitis media.  Viral URI with cough.  Patient declines COVID test today.  Afebrile, vital signs stable.  Treating with Zithromax.  Also treating with Tessalon Perles as needed for cough.  Discussed other symptomatic treatment including Tylenol or ibuprofen as needed for fever or discomfort and Mucinex as needed for congestion.  Instructed patient to follow-up with his PCP if his symptoms are not improving.  He agrees to plan of care.   Final Clinical Impressions(s) / UC Diagnoses   Final diagnoses:  Right otitis media, unspecified otitis media type  Viral URI with cough     Discharge Instructions     Take the Zithromax as directed.    Follow up with your primary care provider if your symptoms are not improving.        ED Prescriptions    Medication Sig Dispense Auth. Provider   azithromycin (ZITHROMAX) 250 MG tablet Take 1 tablet (250 mg total) by mouth daily. Take first 2 tablets together, then 1 every day until finished. 6 tablet Mickie Bail, NP   benzonatate (TESSALON) 100 MG capsule Take 1 capsule (100 mg total) by mouth 3 (three) times daily as needed for cough. 21 capsule Mickie Bail, NP     PDMP not reviewed this encounter.   Mickie Bail, NP 07/17/20 [phone]

## 2020-07-17 NOTE — Discharge Instructions (Signed)
Take the Zithromax as directed.  Follow up with your primary care provider if your symptoms are not improving.    

## 2021-05-07 ENCOUNTER — Other Ambulatory Visit: Payer: Self-pay

## 2021-05-07 ENCOUNTER — Ambulatory Visit
Admission: EM | Admit: 2021-05-07 | Discharge: 2021-05-07 | Disposition: A | Discharge status: Home / Self Care | Payer: Managed Care, Other (non HMO) | Attending: Internal Medicine | Admitting: Internal Medicine

## 2021-05-07 ENCOUNTER — Encounter: Payer: Self-pay | Admitting: *Deleted

## 2021-05-07 DIAGNOSIS — L02511 Cutaneous abscess of right hand: Secondary | ICD-10-CM | POA: Insufficient documentation

## 2021-05-07 LAB — POCT FASTING CBG KUC MANUAL ENTRY: POCT Glucose (KUC): 151 mg/dL — AB (ref 70–99)

## 2021-05-07 MED ORDER — SULFAMETHOXAZOLE-TRIMETHOPRIM 800-160 MG PO TABS: ORAL_TABLET | ORAL | 0 refills | Status: DC

## 2021-05-07 MED ORDER — KETOROLAC TROMETHAMINE 60 MG/2ML IM SOLN
60.0000 mg | Freq: Once | INTRAMUSCULAR | Status: AC
  Administered 2021-05-07: 60 mg via INTRAMUSCULAR

## 2021-05-07 MED ORDER — HYDROCODONE-ACETAMINOPHEN 5-325 MG PO TABS: 1.0000 | ORAL_TABLET | Freq: Four times a day (QID) | ORAL | 0 refills | Status: DC | PRN

## 2021-05-07 NOTE — ED Triage Notes (Signed)
Pt presents today with an open sore on posterior Rt wrist. There are ares of redness around wound and up Rt arm. Pt also is a IDDM and has been without insulin for a period of time. Pt unclear how long . Pt reports he had a dose of insuline last night.

## 2021-05-07 NOTE — ED Provider Notes (Addendum)
Zachary Alexander    CSN: [id number] Arrival date & time: 05/07/21  0957      History   Chief Complaint Chief Complaint  Patient presents with   Skin Ulcer    HPI Zachary Alexander is a 36 y.o. male who presents with painful red lump x 3 days. 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 Denies Hx of MRSA, but had a abscess on his neck drained in the past, which eventually had surgery for.      Past Medical History:  Diagnosis Date   Diabetes mellitus without complication [facility])     Patient Active Problem List   Diagnosis Date Noted   Varicose veins of leg with pain, bilateral 10/29/2016   Swelling of limb 10/08/2016   Diabetes mellitus without complication (HCC) 10/08/2016   Pain in limb 10/08/2016    Past Surgical History:  Procedure Laterality Date   CYST REMOVAL NECK     TONSILLECTOMY         Home Medications    Prior to Admission medications   Medication Sig Start Date End Date Taking? Authorizing Provider  HYDROcodone-acetaminophen (NORCO) 5-325 MG tablet Take 1 tablet by mouth every 6 (six) hours as needed for moderate pain. 05/07/21  Yes Rodriguez-Southworth, Nettie Elm, PA-C  sulfamethoxazole-trimethoprim (BACTRIM DS) 800-160 MG tablet 2 bid til gone 05/07/21  Yes Rodriguez-Southworth, Nettie Elm, PA-C  busPIRone (BUSPAR) 5 MG tablet Take 1 tablet by mouth 2 (two) times daily. 06/29/20   [provider]  furosemide (LASIX) 20 MG tablet Take 1 tablet (20 mg total) by mouth daily for 3 days. 06/12/19 06/15/19  Concha Se, MD  glimepiride (AMARYL) 2 MG tablet Take by mouth. 06/17/16 06/17/17  [provider]  glipiZIDE (GLUCOTROL) 5 MG tablet Take 1 tablet (5 mg total) by mouth daily before breakfast. 06/23/15 07/23/15  Darci Current, MD  metFORMIN (GLUCOPHAGE) 500 MG tablet Take 1 tablet (500 mg total) by mouth 2 (two) times daily with a meal. 06/21/15 06/20/16  Phineas Semen, MD  sertraline  (ZOLOFT) 50 MG tablet Take by mouth. 06/17/16 06/17/17  [provider]    Family History Family History  Problem Relation Age of Onset   Liver disease Father    Diabetes Paternal Grandmother    Heart attack Paternal Grandmother    Diabetes Paternal Grandfather     Social History Social History   Tobacco Use   Smoking status: Never   Smokeless tobacco: Never  Substance Use Topics   Alcohol use: Yes    Comment: rare   Drug use: No     Allergies   Penicillins, Codeine, and Amoxicillin   Review of Systems Review of Systems  Constitutional:  Negative for fever.  Skin:  Positive for color change and wound. Negative for rash.  Hematological:  Negative for adenopathy.    Physical Exam Triage Vital Signs ED Triage Vitals  Enc Vitals Group     BP 05/07/21 1048 (!) 148/78     Pulse Rate 05/07/21 1048 60     Resp 05/07/21 1048 20     Temp 05/07/21 1048 98.6 F (37 C)     Temp src --      SpO2 05/07/21 1048 97 %     Weight --      Height --      Head Circumference --      Peak Flow --  Pain Score 05/07/21 1045 9     Pain Loc --      Pain Edu? --      Excl. in GC? --    No data found.  Updated Vital Signs BP (!) 148/78    Pulse 60    Temp 98.6 F (37 C)    Resp 20    SpO2 97%   Visual Acuity Right Eye Distance:   Left Eye Distance:   Bilateral Distance:    Right Eye Near:   Left Eye Near:    Bilateral Near:     Physical Exam Vitals and nursing note reviewed.  Constitutional:      General: He is not in acute distress.    Appearance: He is obese. He is not toxic-appearing.  HENT:     Right Ear: External ear normal.     Left Ear: External ear normal.  Eyes:     General: No scleral icterus.    Conjunctiva/sclera: Conjunctivae normal.  Pulmonary:     Effort: Pulmonary effort is normal.  Musculoskeletal:        General: Normal range of motion.     Cervical back: Neck supple.     Comments: Does not have bone pain of wrist    Lymphadenopathy:     Upper Body:     Right upper body: No axillary or epitrochlear adenopathy.  Skin:    General: Skin is warm and dry.     Comments: R HAND- with 1.5 x 1.5 induration and redness with a postule on the side which I punctured and obtained a culture from.   Neurological:     Mental Status: He is alert and oriented to person, place, and time.     Gait: Gait normal.  Psychiatric:        Mood and Affect: Mood normal.        Behavior: Behavior normal.        Thought Content: Thought content normal.        Judgment: Judgment normal.     UC Treatments / Results  Labs (all labs ordered are listed, but only abnormal results are displayed) Labs Reviewed  POCT FASTING CBG [facility] MANUAL ENTRY - Abnormal; Notable for the following components:      Result Value   POCT Glucose ([facility]) 151 (*)    All other components within normal limits  AEROBIC CULTURE W GRAM STAIN (SUPERFICIAL SPECIMEN)    EKG   Radiology No results found.  Procedures Procedures (including critical care time) Area was cleansed with alcohol and with sterile 27 G 1/2 inch needle and punctured the pustule and obtained wound culture. I tried to massage it and drain more, but was too painful for pt.  Medications Ordered in UC Medications  ketorolac (TORADOL) injection 60 mg (60 mg Intramuscular Given 05/07/21 1127)    Initial Impression / Assessment and Plan / UC Course  I have reviewed the triage vital signs and the nursing notes. Abscess R wrist I sent wound culture out and we will inform him of results and if not MRSA, may decrease the bactrim to one bid if sensitive. He was given Toradol 60 mg IM here I placed him on Bactrim DS 2 bid x 7 days. See instructions.     Final Clinical Impressions(s) / UC Diagnoses   Final diagnoses:  Abscess of hand, right     Discharge Instructions      Apply heat on abscess area over the bandage for 20 minutes 2-4  times a day which will help the puss continue to  drain. If the area gets larger or with worse pain, please come back.  The wound culture may come back in 2-3 days and we will call you if we need to change your medication.  I am placing you on double dose of antibiotic to cover MRSA, but if the culture is negative for this, then we will have you decrease your dose to one tablet twice a day.  If you get a red streak coming up your upper arm towards your arm pit, you will need IV antibiotics and you need to go to the ER for that.  Monitor your temperature in the mean time.  Keep the wound covered while it is draining when at work.     ED Prescriptions     Medication Sig Dispense Auth. Provider   HYDROcodone-acetaminophen (NORCO) 5-325 MG tablet Take 1 tablet by mouth every 6 (six) hours as needed for moderate pain. 10 tablet Rodriguez-Southworth, Nettie Elm, PA-C   sulfamethoxazole-trimethoprim (BACTRIM DS) 800-160 MG tablet 2 bid til gone 28 tablet Rodriguez-Southworth, Nettie Elm, [place]      I have reviewed the PDMP during this encounter.   Garey Ham, PA-C 05/07/21 1146    Rodriguez-Southworth, [place], [place] 05/07/21 1146

## 2021-05-07 NOTE — Discharge Instructions (Addendum)
Apply heat on abscess area over the bandage for 20 minutes 2-4 times a day which will help the puss continue to drain. If the area gets larger or with worse pain, please come back.  The wound culture may come back in 2-3 days and we will call you if we need to change your medication.  I am placing you on double dose of antibiotic to cover MRSA, but if the culture is negative for this, then we will have you decrease your dose to one tablet twice a day.  If you get a red streak coming up your upper arm towards your arm pit, you will need IV antibiotics and you need to go to the ER for that.  Monitor your temperature in the mean time.  Keep the wound covered while it is draining when at work.

## 2021-05-09 LAB — AEROBIC CULTURE W GRAM STAIN (SUPERFICIAL SPECIMEN)
Gram Stain: NONE SEEN
Special Requests: NORMAL

## 2021-07-15 IMAGING — US US EXTREM LOW VENOUS
1 series · 14 of 24 positions shown · non-contrast
Comparison: None.

CLINICAL DATA: Bilateral lower extremity swelling for a few days.

EXAM:
BILATERAL LOWER EXTREMITY VENOUS DOPPLER ULTRASOUND
TECHNIQUE: Gray-scale sonography with compression, as well as color and duplex
ultrasound, were performed to evaluate the deep venous system(s)
from the level of the common femoral vein through the popliteal and
proximal calf veins.

[Series 1: us venous img lower bilat (dvt) · portal-venous · 14 of 60 slices shown]
[im 1/60]
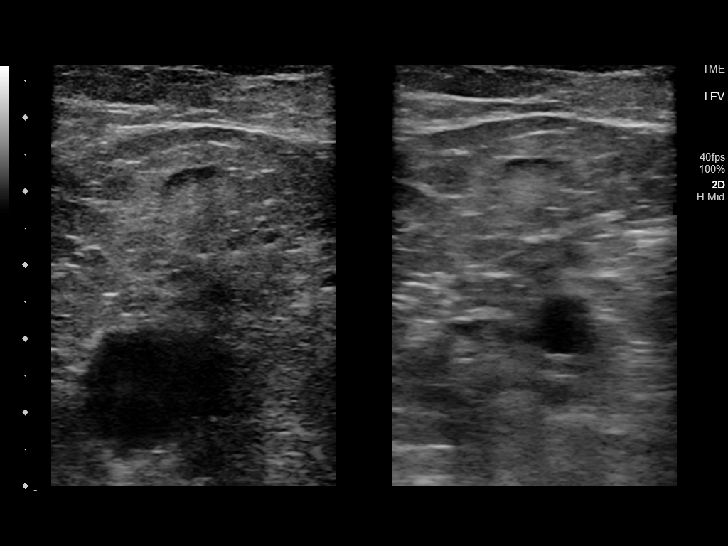
[im 6/60]
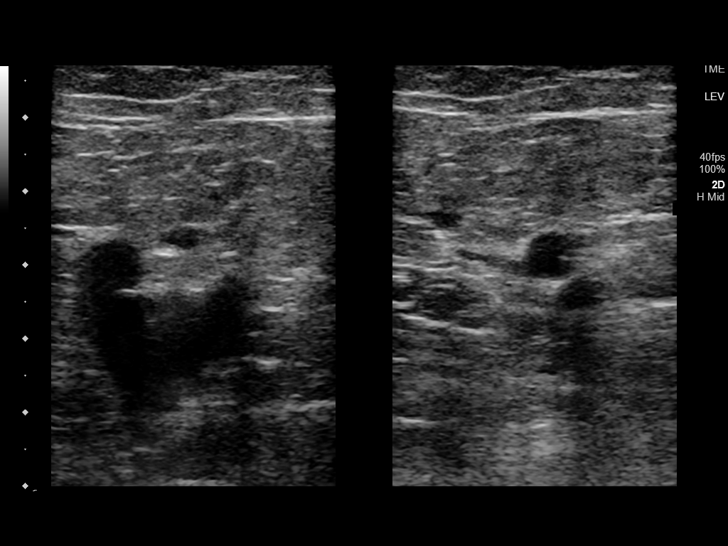
[im 11/60]
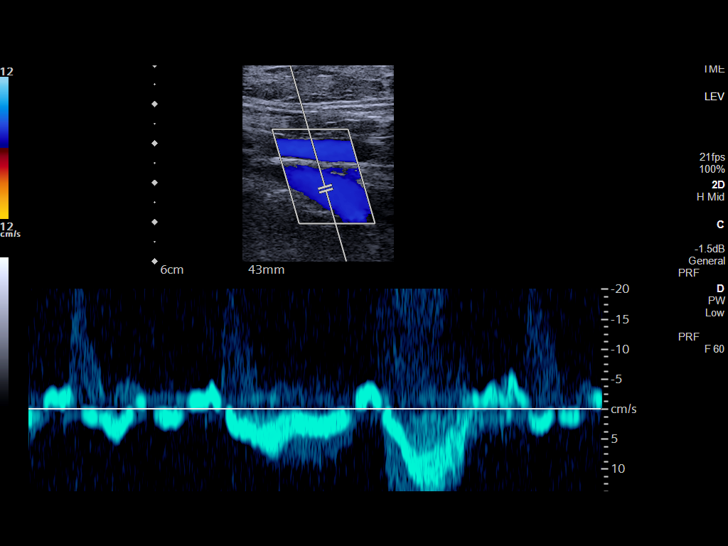
[im 16/60]
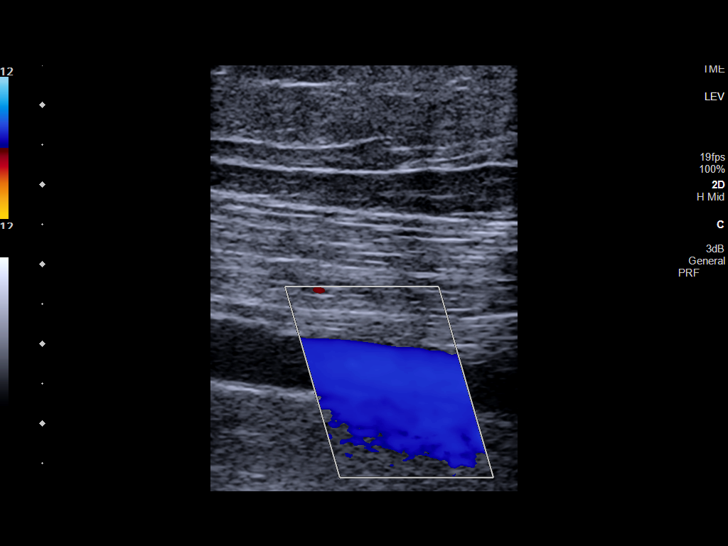
[im 18/60]
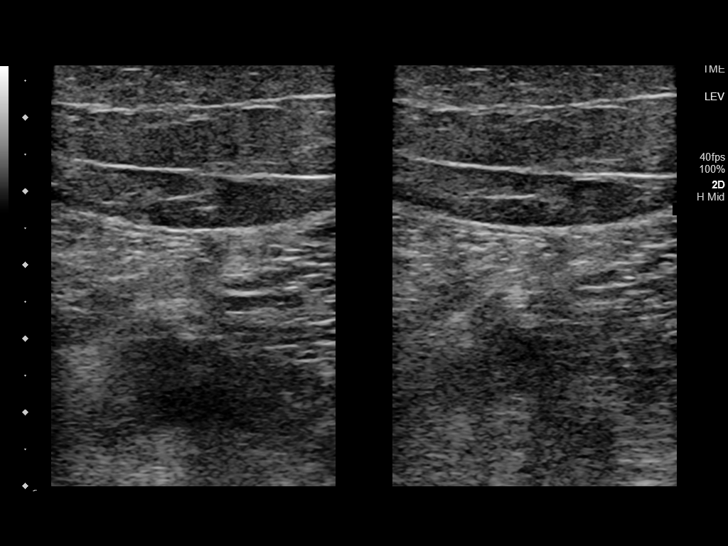
[im 24/60]
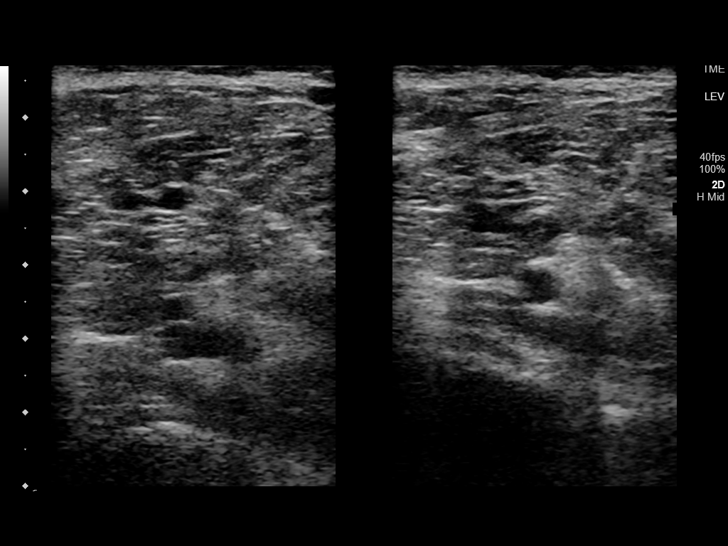
[im 29/60]
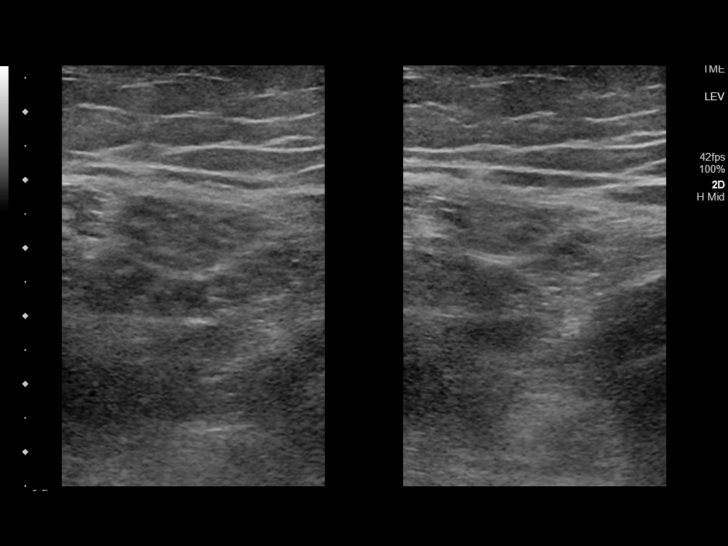
[im 31/60]
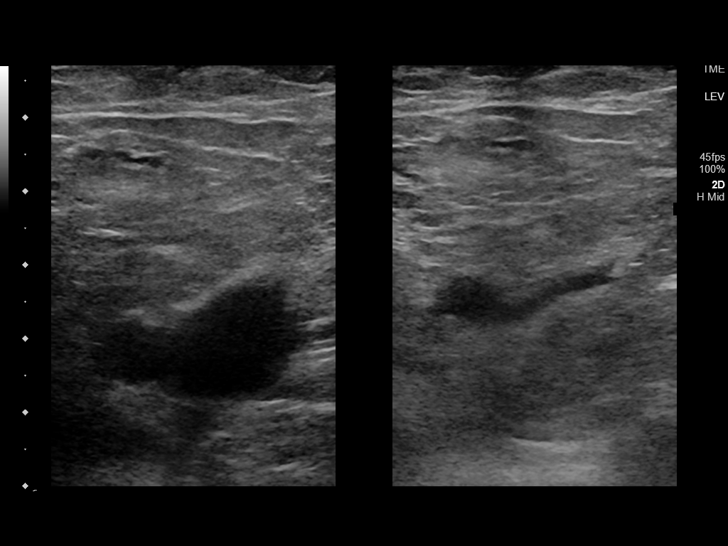
[im 36/60]
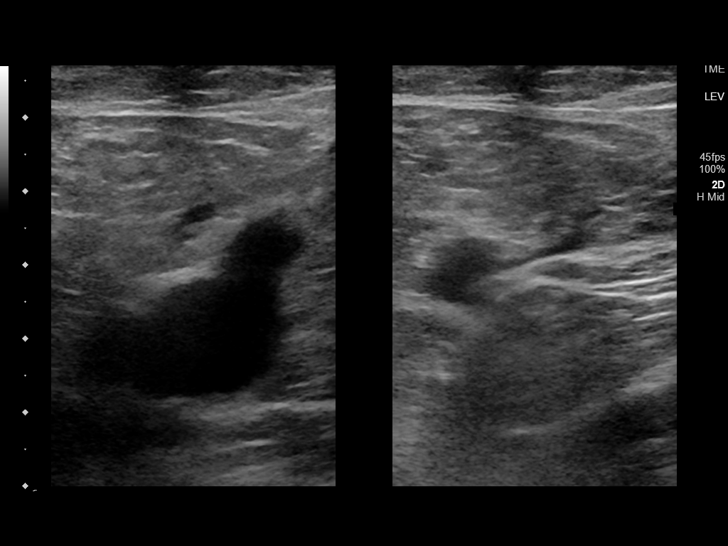
[im 42/60]
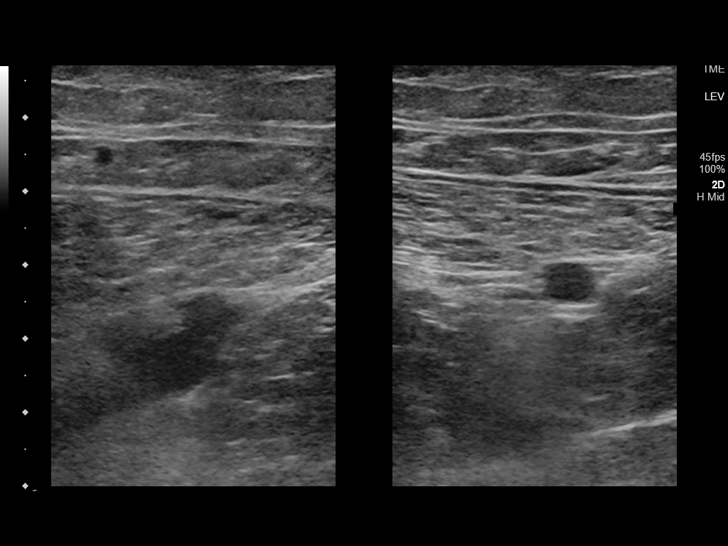
[im 47/60]
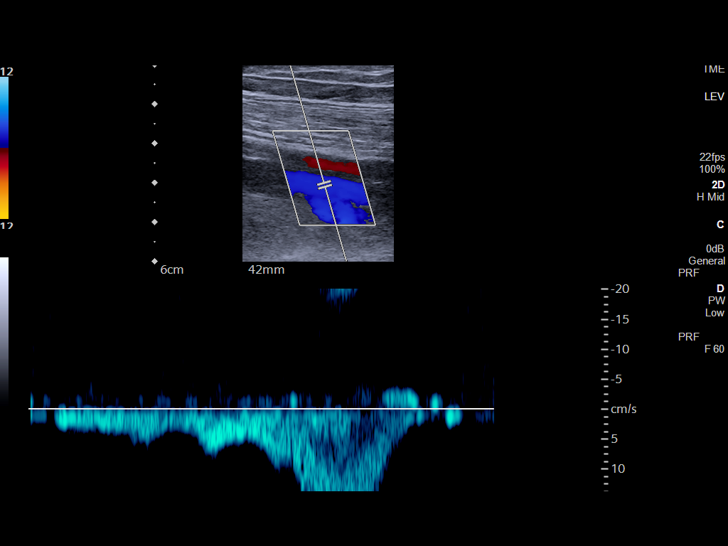
[im 49/60]
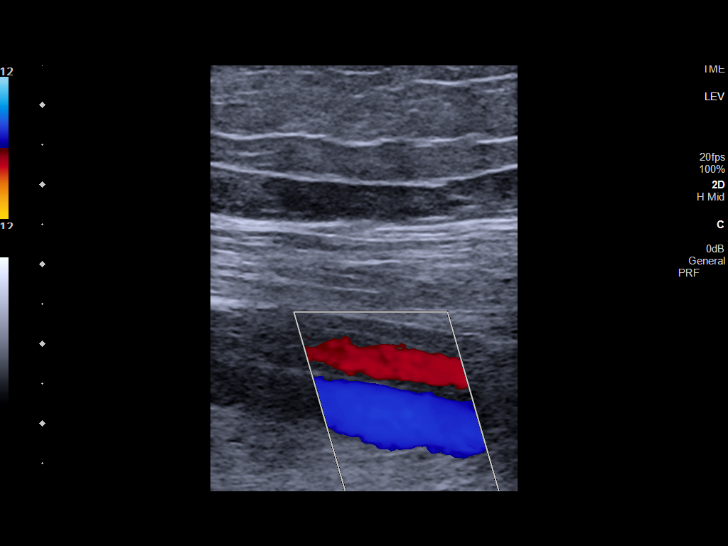
[im 54/60]
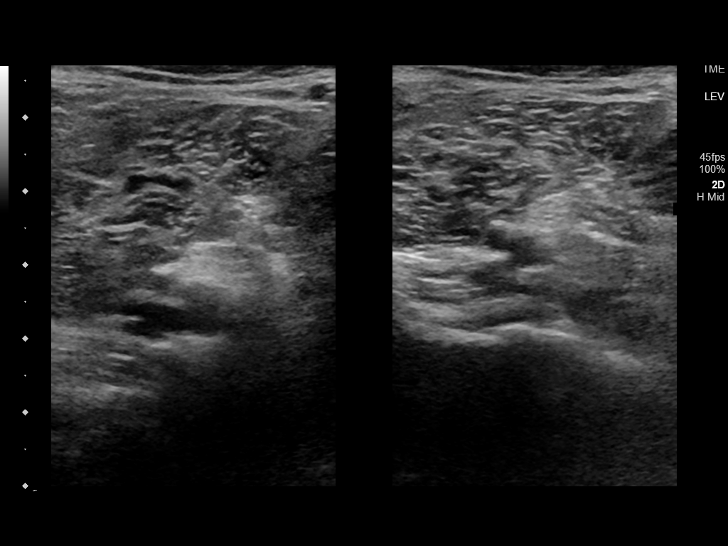
[im 60/60]
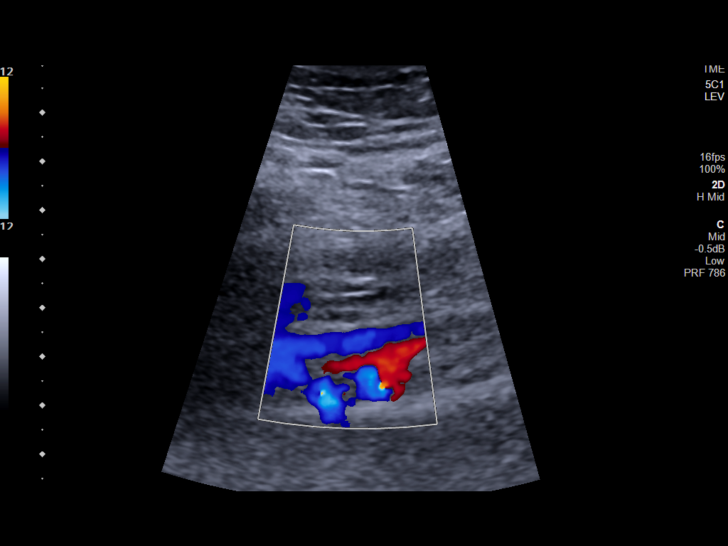

[14 of 24 positions shown; findings below may reference images not displayed]

FINDINGS: VENOUS

Normal compressibility of the common femoral, superficial femoral,
and popliteal veins, as well as the visualized calf veins.
Visualized portions of profunda femoral vein and great saphenous
vein unremarkable. No filling defects to suggest DVT on grayscale or
color Doppler imaging. Doppler waveforms show normal direction of
venous flow, normal respiratory phasicity and response to
augmentation.

Limited views of the contralateral common femoral vein are
unremarkable.

OTHER

None.

Limitations: none
IMPRESSION: No femoropopliteal DVT nor evidence of DVT within the visualized
calf veins.

If clinical symptoms are inconsistent or if there are persistent or
worsening symptoms, further imaging (possibly involving the iliac
veins) may be warranted.

## 2021-10-10 ENCOUNTER — Other Ambulatory Visit: Payer: Self-pay

## 2021-10-10 ENCOUNTER — Emergency Department: Payer: Managed Care, Other (non HMO)

## 2021-10-10 ENCOUNTER — Emergency Department
Admission: EM | Admit: 2021-10-10 | Discharge: 2021-10-11 | Disposition: A | Discharge status: Home / Self Care | Payer: Managed Care, Other (non HMO) | Attending: Emergency Medicine | Admitting: Emergency Medicine

## 2021-10-10 ENCOUNTER — Encounter: Payer: Self-pay | Admitting: *Deleted

## 2021-10-10 DIAGNOSIS — E119 Type 2 diabetes mellitus without complications: Secondary | ICD-10-CM | POA: Diagnosis not present

## 2021-10-10 DIAGNOSIS — Z79899 Other long term (current) drug therapy: Secondary | ICD-10-CM | POA: Diagnosis not present

## 2021-10-10 DIAGNOSIS — R42 Dizziness and giddiness: Secondary | ICD-10-CM | POA: Diagnosis not present

## 2021-10-10 DIAGNOSIS — Z7984 Long term (current) use of oral hypoglycemic drugs: Secondary | ICD-10-CM | POA: Insufficient documentation

## 2021-10-10 DIAGNOSIS — I10 Essential (primary) hypertension: Secondary | ICD-10-CM | POA: Diagnosis not present

## 2021-10-10 LAB — BASIC METABOLIC PANEL
Anion gap: 8 (ref 5–15)
BUN: 15 mg/dL (ref 6–20)
CO2: 27 mmol/L (ref 22–32)
Calcium: 9.8 mg/dL (ref 8.9–10.3)
Chloride: 106 mmol/L (ref 98–111)
Creatinine, Ser: 1.12 mg/dL (ref 0.61–1.24)
GFR, Estimated: >60 mL/min (ref >60)
Glucose, Bld: 135 mg/dL — ABNORMAL HIGH (ref 70–99)
Potassium: 3.9 mmol/L (ref 3.5–5.1)
Sodium: 141 mmol/L (ref 135–145)

## 2021-10-10 LAB — CBC
HCT: 44.3 % (ref 39.0–52.0)
Hemoglobin: 14.7 g/dL (ref 13.0–17.0)
MCH: 31.4 pg (ref 26.0–34.0)
MCHC: 33.2 g/dL (ref 30.0–36.0)
MCV: 94.7 fL (ref 80.0–100.0)
Platelets: 387 10*3/uL (ref 150–400)
RBC: 4.68 MIL/uL (ref 4.22–5.81)
RDW: 12.1 % (ref 11.5–15.5)
WBC: 8.7 10*3/uL (ref 4.0–10.5)
nRBC: 0 % (ref 0.0–0.2)

## 2021-10-10 LAB — TROPONIN I (HIGH SENSITIVITY): Troponin I (High Sensitivity): 3 ng/L (ref <18)

## 2021-10-10 NOTE — ED Triage Notes (Signed)
Pt reports feeling lighheaded and dizzy today while at work .   Pt denies chest pain or sob.  No n/v.  Pt alert

## 2021-10-10 NOTE — ED Notes (Signed)
Pt brought to ED rm 16 at this time, this RN now assuming care. 

## 2021-10-11 LAB — TROPONIN I (HIGH SENSITIVITY): Troponin I (High Sensitivity): 3 ng/L (ref <18)
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
## 2021-10-11 NOTE — ED Notes (Signed)
Pt dc'd during downtime, downtime DC paperwork signed.

## 2021-10-25 ENCOUNTER — Ambulatory Visit: Payer: Managed Care, Other (non HMO) | Admitting: Infectious Diseases

## 2021-11-18 ENCOUNTER — Other Ambulatory Visit: Payer: Self-pay

## 2021-11-18 ENCOUNTER — Encounter: Payer: Self-pay | Admitting: Intensive Care

## 2021-11-18 ENCOUNTER — Emergency Department
Admission: EM | Admit: 2021-11-18 | Discharge: 2021-11-18 | Disposition: A | Discharge status: Home / Self Care | Payer: No Typology Code available for payment source | Attending: Emergency Medicine | Admitting: Emergency Medicine

## 2021-11-18 DIAGNOSIS — Y990 Civilian activity done for income or pay: Secondary | ICD-10-CM | POA: Insufficient documentation

## 2021-11-18 DIAGNOSIS — X58XXXA Exposure to other specified factors, initial encounter: Secondary | ICD-10-CM | POA: Insufficient documentation

## 2021-11-18 DIAGNOSIS — E119 Type 2 diabetes mellitus without complications: Secondary | ICD-10-CM | POA: Diagnosis not present

## 2021-11-18 DIAGNOSIS — Z7721 Contact with and (suspected) exposure to potentially hazardous body fluids: Secondary | ICD-10-CM | POA: Diagnosis not present

## 2021-11-18 DIAGNOSIS — S61201A Unspecified open wound of left index finger without damage to nail, initial encounter: Secondary | ICD-10-CM | POA: Diagnosis not present

## 2021-11-18 DIAGNOSIS — S6992XA Unspecified injury of left wrist, hand and finger(s), initial encounter: Secondary | ICD-10-CM | POA: Diagnosis present

## 2021-11-18 LAB — COMPREHENSIVE METABOLIC PANEL
ALT: 43 U/L (ref 0–44)
AST: 27 U/L (ref 15–41)
Albumin: 4.8 g/dL (ref 3.5–5.0)
Alkaline Phosphatase: 84 U/L (ref 38–126)
Anion gap: 7 (ref 5–15)
BUN: 18 mg/dL (ref 6–20)
CO2: 28 mmol/L (ref 22–32)
Calcium: 9.9 mg/dL (ref 8.9–10.3)
Chloride: 107 mmol/L (ref 98–111)
Creatinine, Ser: 1.01 mg/dL (ref 0.61–1.24)
GFR, Estimated: >60 mL/min (ref >60)
Glucose, Bld: 106 mg/dL — ABNORMAL HIGH (ref 70–99)
Potassium: 4.2 mmol/L (ref 3.5–5.1)
Sodium: 142 mmol/L (ref 135–145)
Total Bilirubin: 0.8 mg/dL (ref 0.3–1.2)
Total Protein: 8.4 g/dL — ABNORMAL HIGH (ref 6.5–8.1)

## 2021-11-18 LAB — CBC WITH DIFFERENTIAL/PLATELET
Abs Immature Granulocytes: 0.03 10*3/uL (ref 0.00–0.07)
Basophils Absolute: 0.1 10*3/uL (ref 0.0–0.1)
Basophils Relative: 1 %
Eosinophils Absolute: 0.1 10*3/uL (ref 0.0–0.5)
Eosinophils Relative: 1 %
HCT: 45 % (ref 39.0–52.0)
Hemoglobin: 15.1 g/dL (ref 13.0–17.0)
Immature Granulocytes: 0 %
Lymphocytes Relative: 23 %
Lymphs Abs: 2.4 10*3/uL (ref 0.7–4.0)
MCH: 31.2 pg (ref 26.0–34.0)
MCHC: 33.6 g/dL (ref 30.0–36.0)
MCV: 93 fL (ref 80.0–100.0)
Monocytes Absolute: 0.4 10*3/uL (ref 0.1–1.0)
Monocytes Relative: 4 %
Neutro Abs: 7.7 10*3/uL (ref 1.7–7.7)
Neutrophils Relative %: 71 %
Platelets: 409 10*3/uL — ABNORMAL HIGH (ref 150–400)
RBC: 4.84 MIL/uL (ref 4.22–5.81)
RDW: 11.7 % (ref 11.5–15.5)
WBC: 10.8 10*3/uL — ABNORMAL HIGH (ref 4.0–10.5)
nRBC: 0 % (ref 0.0–0.2)

## 2021-11-18 MED ORDER — DOLUTEGRAVIR SODIUM 50 MG PO TABS: 50.0000 mg | ORAL_TABLET | Freq: Every day | ORAL | 0 refills | Status: DC

## 2021-11-18 MED ORDER — EMTRICITABINE-TENOFOVIR AF 200-25 MG PO TABS
1.0000 | ORAL_TABLET | Freq: Every day | ORAL | 0 refills | Status: DC
  Filled 2021-11-19: qty 5, 5d supply, fill #0

## 2021-11-18 MED ORDER — EMTRICITABINE-TENOFOVIR AF 200-25 MG PO TABS: 1.0000 | ORAL_TABLET | Freq: Every day | ORAL | 0 refills | Status: DC

## 2021-11-18 MED ORDER — DOLUTEGRAVIR SODIUM 50 MG PO TABS
50.0000 mg | ORAL_TABLET | Freq: Every day | ORAL | 0 refills | Status: DC
  Filled 2021-11-19: qty 5, 5d supply, fill #0

## 2021-11-18 MED ORDER — DOLUTEGRAVIR SODIUM 50 MG PO TABS
50.0000 mg | ORAL_TABLET | Freq: Every day | ORAL | Status: DC
  Administered 2021-11-18: 50 mg via ORAL
  Filled 2021-11-18: qty 1

## 2021-11-18 MED ORDER — EMTRICITABINE-TENOFOVIR AF 200-25 MG PO TABS
1.0000 | ORAL_TABLET | Freq: Every day | ORAL | Status: DC
  Administered 2021-11-18: 1 via ORAL
  Filled 2021-11-18: qty 1
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
## 2021-11-18 NOTE — ED Notes (Signed)
Dc ppw provided to pt. RX info and followup reviewed as applicable. Pt provides verbal consent for dc. Pt ambultory off unit to lobby. 
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
## 2021-11-18 NOTE — ED Provider Notes (Signed)
  Physical Exam  BP 123/88 (BP Location: Left Arm)   Pulse 100   Temp 97.9 F (36.6 C) (Oral)   Resp 18   Ht 5\' 10"  (1.778 m)   Wt 111.1 kg   SpO2 96%   BMI 35.15 kg/m   Physical Exam  Procedures  Procedures  ED Course / MDM    Medical Decision Making Amount and/or Complexity of Data Reviewed Labs: ordered.  Risk Prescription drug management.   Assumed patient care from Dr. Cheron Schaumann.  I reached out to lead resident at [facility] to add on RNA 1 and 2 qualitative testing as requested by Dr. Avon Gully.  [facility] representative stated that HIV antibody testing was still in process.  He stated that he would call me before the end of his shift with the status update.  I discussed information with patient and his wife and they requested discharge at this time.  They understand that they may need to follow-up with infectious disease in the future given HIV testing results.  ----------------------------------------- 10:42 PM on 11/18/2021 ----------------------------------------- [facility] does not have results for HIV antibody or RNA 1 and 2 qualitative testing.  I reached out to Mr. Mascioli to give him an update.  I will call patient at 3:00 PM tomorrow when my shift starts to give them an update.       Pia Mau [place], PA-C 11/18/21 2243    Pilar Jarvis, MD 11/19/21 [phone]
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
## 2021-11-19 ENCOUNTER — Other Ambulatory Visit: Payer: Self-pay

## 2021-11-21 ENCOUNTER — Telehealth: Payer: Self-pay | Admitting: Emergency Medicine

## 2021-11-21 NOTE — Telephone Encounter (Signed)
Called patient to notify of need to stop post exposure prophylaxis meds.  Left message asking him to call me.

## 2021-11-22 ENCOUNTER — Encounter: Payer: Self-pay | Admitting: Infectious Diseases

## 2021-11-22 ENCOUNTER — Ambulatory Visit: Payer: Managed Care, Other (non HMO) | Attending: Infectious Diseases | Admitting: Infectious Diseases

## 2021-11-22 VITALS — BP 119/81 | HR 73 | Temp 96.8°F | Ht 70.0 in | Wt 243.0 lb

## 2021-11-22 DIAGNOSIS — Z7721 Contact with and (suspected) exposure to potentially hazardous body fluids: Secondary | ICD-10-CM | POA: Diagnosis present

## 2021-11-22 DIAGNOSIS — E119 Type 2 diabetes mellitus without complications: Secondary | ICD-10-CM | POA: Diagnosis not present

## 2021-11-22 DIAGNOSIS — Z9189 Other specified personal risk factors, not elsewhere classified: Secondary | ICD-10-CM | POA: Diagnosis not present
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
## 2021-11-27 ENCOUNTER — Encounter: Payer: Self-pay | Admitting: Infectious Diseases

## 2022-01-24 ENCOUNTER — Ambulatory Visit
Admission: RE | Admit: 2022-01-24 | Discharge: 2022-01-24 | Disposition: A | Discharge status: Home / Self Care | Payer: Managed Care, Other (non HMO) | Attending: Physician Assistant | Admitting: Physician Assistant

## 2022-01-24 ENCOUNTER — Ambulatory Visit
Admission: RE | Admit: 2022-01-24 | Discharge: 2022-01-24 | Disposition: A | Discharge status: Home / Self Care | Payer: Managed Care, Other (non HMO) | Source: Ambulatory Visit | Attending: Physician Assistant | Admitting: Physician Assistant

## 2022-01-24 ENCOUNTER — Other Ambulatory Visit: Payer: Self-pay | Admitting: Physician Assistant

## 2022-01-24 DIAGNOSIS — M25561 Pain in right knee: Secondary | ICD-10-CM | POA: Insufficient documentation

## 2022-02-13 ENCOUNTER — Ambulatory Visit
Admission: EM | Admit: 2022-02-13 | Discharge: 2022-02-13 | Disposition: A | Discharge status: Home / Self Care | Payer: Managed Care, Other (non HMO) | Attending: Family Medicine | Admitting: Family Medicine

## 2022-02-13 DIAGNOSIS — E119 Type 2 diabetes mellitus without complications: Secondary | ICD-10-CM | POA: Insufficient documentation

## 2022-02-13 DIAGNOSIS — R0981 Nasal congestion: Secondary | ICD-10-CM | POA: Diagnosis not present

## 2022-02-13 DIAGNOSIS — Z1152 Encounter for screening for COVID-19: Secondary | ICD-10-CM | POA: Diagnosis not present

## 2022-02-13 DIAGNOSIS — J0100 Acute maxillary sinusitis, unspecified: Secondary | ICD-10-CM

## 2022-02-13 DIAGNOSIS — Z7984 Long term (current) use of oral hypoglycemic drugs: Secondary | ICD-10-CM | POA: Diagnosis not present

## 2022-02-13 MED ORDER — CEFDINIR 300 MG PO CAPS: 600.0000 mg | ORAL_CAPSULE | Freq: Every day | ORAL | 0 refills | Status: AC

## 2022-02-13 MED ORDER — KETOROLAC TROMETHAMINE 30 MG/ML IJ SOLN
30.0000 mg | Freq: Once | INTRAMUSCULAR | Status: AC
  Administered 2022-02-13: 30 mg via INTRAMUSCULAR

## 2022-02-13 NOTE — Discharge Instructions (Addendum)
You have been given a shot of Toradol 30 mg today.  Take cefdinir 300 mg--2 capsules together daily for 7 days (this is antibiotic for possible sinus infection)   You have been swabbed for COVID, and the test will result in the next 24 hours. Our staff will call you if positive. If the COVID test is positive, you should quarantine for 5 days from the start of your symptoms

## 2022-02-13 NOTE — ED Provider Notes (Signed)
Zachary Alexander    CSN: [id number] Arrival date & time: 02/13/22  1052      History   Chief Complaint Chief Complaint  Patient presents with   Facial Pain    HPI Zachary Alexander is a 36 y.o. male.   HPI Here for facial pressure and pain and headache.  It began some overnight and then got worse this morning.  It ended up radiating into his neck.  He also had pain in his upper teeth bilaterally.  No fever or chills.  He has had some mild rhinorrhea and nasal congestion for about 4 days.  He does have diabetes, and states it has been fairly well-controlled.  He is no longer taking the postexposure prophylaxis medications  Took some Sudafed and some Excedrin before coming here and it is given him partial relief Past Medical History:  Diagnosis Date   Diabetes mellitus without complication [facility])     Patient Active Problem List   Diagnosis Date Noted   Varicose veins of leg with pain, bilateral 10/29/2016   Swelling of limb 10/08/2016   Diabetes mellitus without complication ([place]) 70/96/4383   Pain in limb 10/08/2016    Past Surgical History:  Procedure Laterality Date   CYST REMOVAL NECK     TONSILLECTOMY         Home Medications    Prior to Admission medications   Medication Sig Start Date End Date Taking? Authorizing Provider  cefdinir (OMNICEF) 300 MG capsule Take 2 capsules (600 mg total) by mouth daily for 7 days. 02/13/22 02/20/22 Yes Zachary Hush, Gwenlyn Perking, MD  busPIRone (BUSPAR) 10 MG tablet Take 10 mg by mouth 2 (two) times daily. 10/11/21   [provider]  gabapentin (NEURONTIN) 400 MG capsule Take 400 mg by mouth 3 (three) times daily. 10/11/21   [provider]  glimepiride (AMARYL) 4 MG tablet Take 4 mg by mouth 2 (two) times daily. 09/21/21   [provider]  losartan (COZAAR) 25 MG tablet Take 25 mg by mouth daily. 07/27/21   [provider]  metFORMIN (GLUCOPHAGE-XR) 500 MG 24 hr tablet Take 1,000 mg by mouth 2  (two) times daily. 07/27/21   [provider]  MOUNJARO 5 MG/0.5ML Pen Inject 0.5 mLs into the skin once a week. 11/15/21   [provider]  rosuvastatin (CRESTOR) 10 MG tablet Take 10 mg by mouth daily. 11/16/21   [provider]  sertraline (ZOLOFT) 100 MG tablet Take 100 mg by mouth daily. 10/11/21   [provider]    Family History Family History  Problem Relation Age of Onset   Liver disease Father    Diabetes Paternal Grandmother    Heart attack Paternal Grandmother    Diabetes Paternal Grandfather     Social History Social History   Tobacco Use   Smoking status: Never   Smokeless tobacco: Never  Vaping Use   Vaping Use: Never used  Substance Use Topics   Alcohol use: Yes    Comment: rare   Drug use: No     Allergies   Penicillins, Codeine, and Amoxicillin   Review of Systems Review of Systems   Physical Exam Triage Vital Signs ED Triage Vitals  Enc Vitals Group     BP 02/13/22 1142 125/79     Pulse Rate 02/13/22 1142 68     Resp 02/13/22 1142 18     Temp 02/13/22 1142 98.2 F (36.8 C)     Temp src --  SpO2 02/13/22 1142 98 %     Weight 02/13/22 1146 252 lb (114.3 kg)     Height 02/13/22 1146 _0  (1.778 m)     Head Circumference --      Peak Flow --      Pain Score 02/13/22 1142 4     Pain Loc --      Pain Edu? --      Excl. in [place]? --    No data found.  Updated Vital Signs BP 125/79   Pulse 68   Temp 98.2 F (36.8 C)   Resp 18   Ht _1  (1.778 m)   Wt 114.3 kg   SpO2 98%   BMI 36.16 kg/m   Visual Acuity Right Eye Distance:   Left Eye Distance:   Bilateral Distance:    Right Eye Near:   Left Eye Near:    Bilateral Near:     Physical Exam Vitals reviewed.  Constitutional:      General: He is not in acute distress.    Appearance: He is not ill-appearing, toxic-appearing or diaphoretic.  HENT:     Right Ear: Tympanic membrane and ear canal normal.     Left Ear: Tympanic membrane and ear  canal normal.     Nose: Congestion present.     Mouth/Throat:     Mouth: Mucous membranes are moist.     Comments: There are some clear mucus draining Eyes:     Extraocular Movements: Extraocular movements intact.     Conjunctiva/sclera: Conjunctivae normal.     Pupils: Pupils are equal, round, and reactive to light.  Cardiovascular:     Rate and Rhythm: Normal rate and regular rhythm.     Heart sounds: No murmur heard. Pulmonary:     Effort: No respiratory distress.     Breath sounds: No stridor. No wheezing, rhonchi or rales.  Musculoskeletal:     Cervical back: Neck supple.  Lymphadenopathy:     Cervical: No cervical adenopathy.  Skin:    Capillary Refill: Capillary refill takes less than 2 seconds.     Coloration: Skin is not jaundiced or pale.  Neurological:     General: No focal deficit present.     Mental Status: He is alert and oriented to person, place, and time.     Cranial Nerves: No cranial nerve deficit.     Sensory: No sensory deficit.     Gait: Gait normal.     Deep Tendon Reflexes: Reflexes normal.  Psychiatric:        Behavior: Behavior normal.      UC Treatments / Results  Labs (all labs ordered are listed, but only abnormal results are displayed) Labs Reviewed  SARS CORONAVIRUS 2 (TAT 6-24 HRS)    EKG   Radiology No results found.  Procedures Procedures (including critical care time)  Medications Ordered in UC Medications  ketorolac (TORADOL) 30 MG/ML injection 30 mg (has no administration in time range)    Initial Impression / Assessment and Plan / UC Course  I have reviewed the triage vital signs and the nursing notes.  Pertinent labs & imaging results that were available during my care of the patient were reviewed by me and considered in my medical decision making (see chart for details).       This appears to be an acute sinusitis with a double sickening, but I do want to test him for COVID since this could have been COVID that  developed in the  last day or so.  If positive he would be a candidate for Paxlovid; last EGFR was greater than 60 He is given a work to return tomorrow, at his request.  He may go and do a rapid COVID at work in the morning before entering the workplace. Final Clinical Impressions(s) / UC Diagnoses   Final diagnoses:  Nasal congestion  Acute maxillary sinusitis, recurrence not specified     Discharge Instructions      You have been given a shot of Toradol 30 mg today.  Take cefdinir 300 mg--2 capsules together daily for 7 days (this is antibiotic for possible sinus infection)   You have been swabbed for COVID, and the test will result in the next 24 hours. Our staff will call you if positive. If the COVID test is positive, you should quarantine for 5 days from the start of your symptoms        ED Prescriptions     Medication Sig Dispense Auth. Provider   cefdinir (OMNICEF) 300 MG capsule Take 2 capsules (600 mg total) by mouth daily for 7 days. 14 capsule Rehaan Viloria, Gwenlyn Perking, MD      I have reviewed the PDMP during this encounter.   Barrett Henle, MD 02/13/22 [phone]

## 2022-02-13 NOTE — ED Triage Notes (Signed)
Patient to Urgent Care with complaints of sinus pain, headache that radiates down into the left side of his neck, jaw tension, and some nasal congestion. Reports pain was so significant he had to leave work. Reports feeling warm earlier but no known fevers.  Symptoms started yesterday.  Has been taking sudafed/ Excedrin.

## 2022-02-14 LAB — SARS CORONAVIRUS 2 (TAT 6-24 HRS): SARS Coronavirus 2: NEGATIVE

## 2022-03-16 IMAGING — US US ABDOMEN LIMITED
1 series · 13 of 25 positions shown · non-contrast
Comparison: Radiograph 06/23/2015

CLINICAL DATA: Elevated LFTs, type 2 diabetes

EXAM:
ULTRASOUND ABDOMEN LIMITED RIGHT UPPER QUADRANT

[Series 1: us abdomen limited ruq (liver/gb) · 13 of 37 slices shown]
[im 1/37]
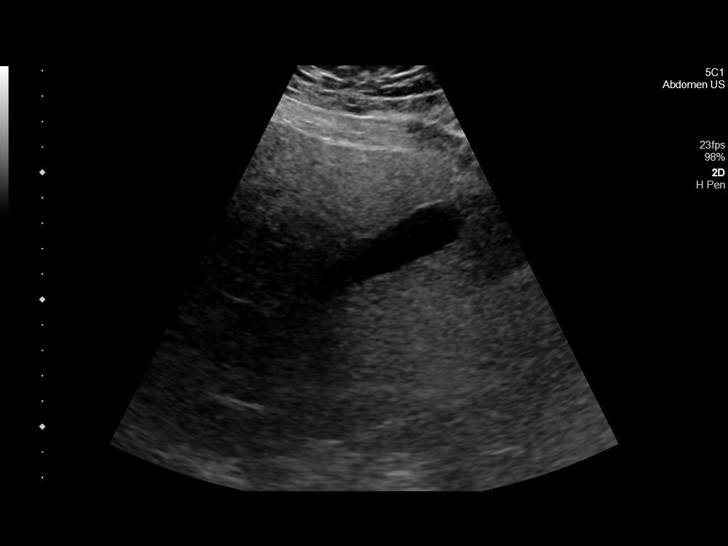
[im 4/37]
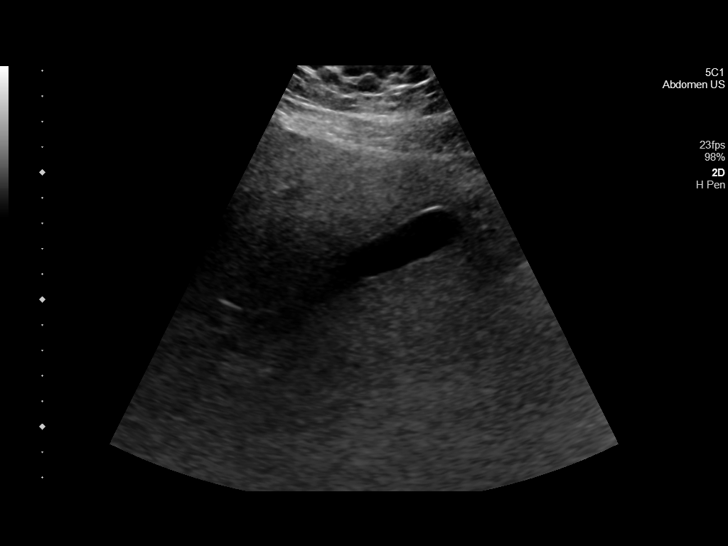
[im 7/37]
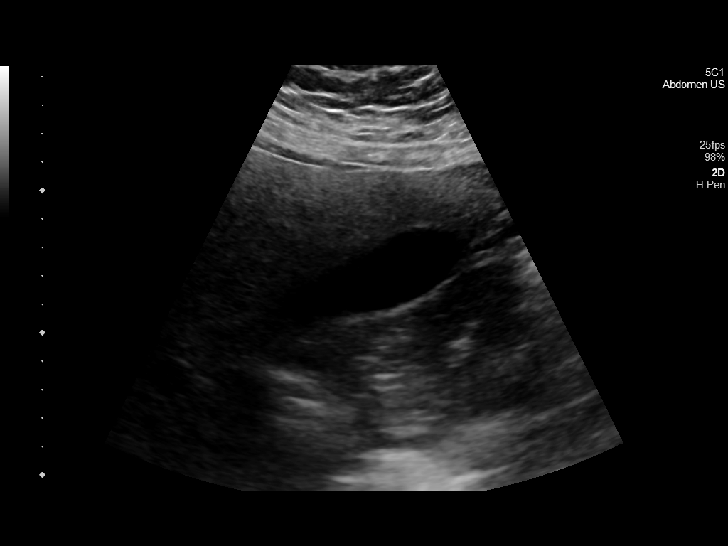
[im 10/37]
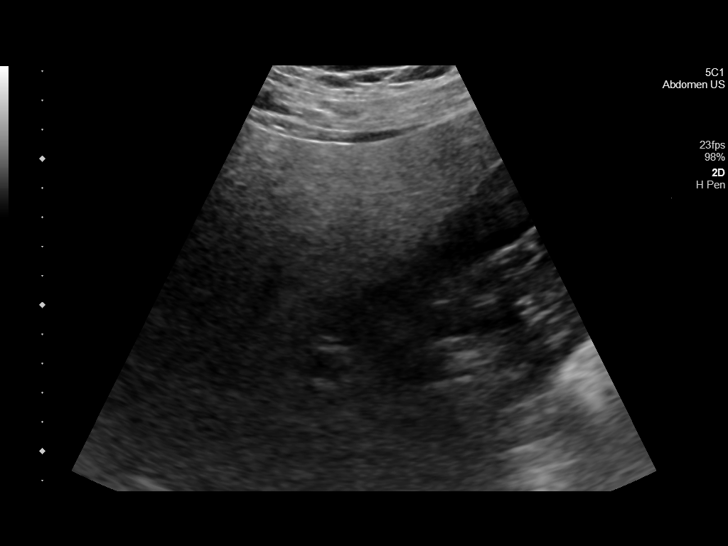
[im 13/37]
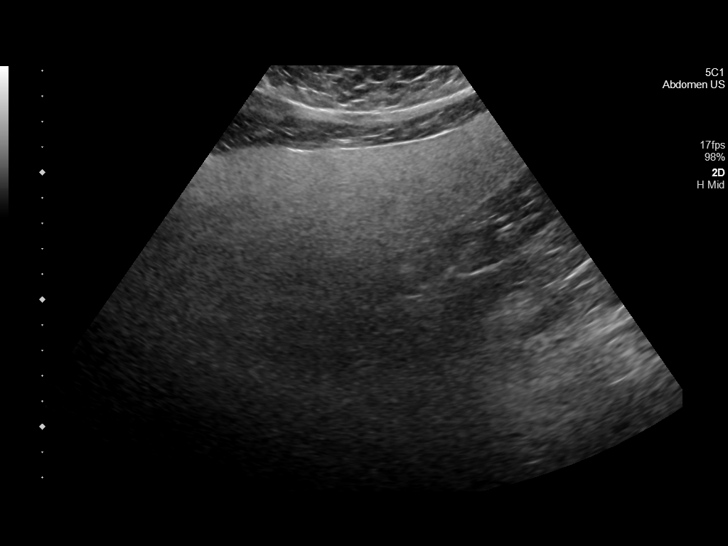
[im 16/37]
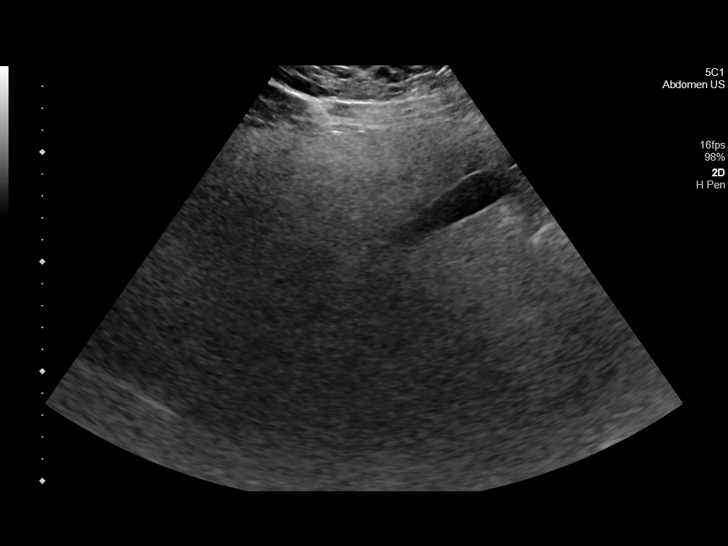
[im 19/37]
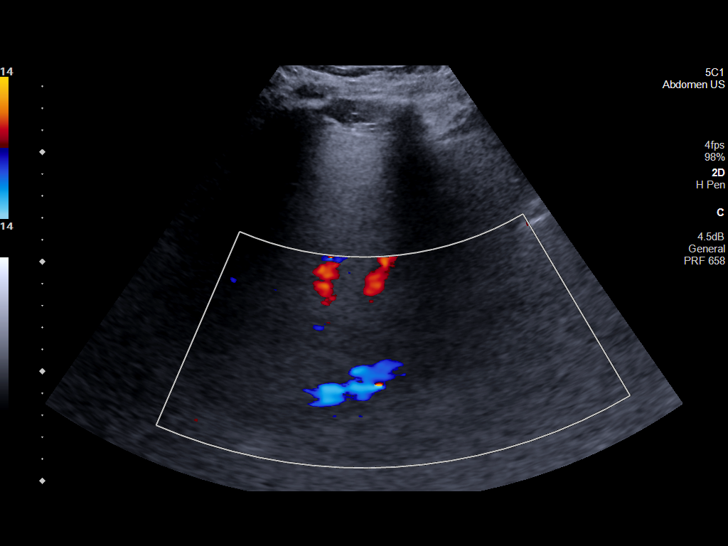
[im 22/37]
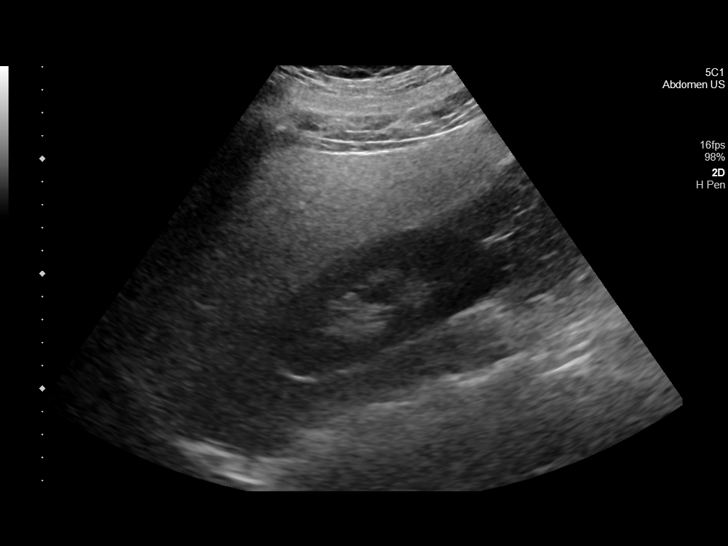
[im 25/37]
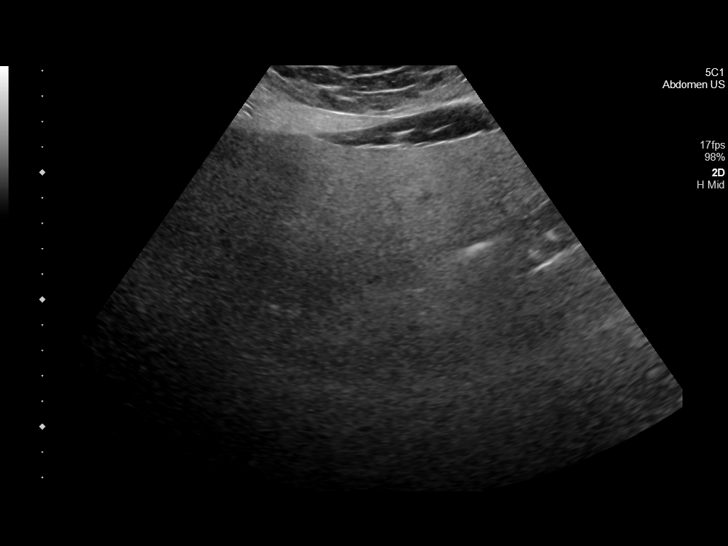
[im 28/37]
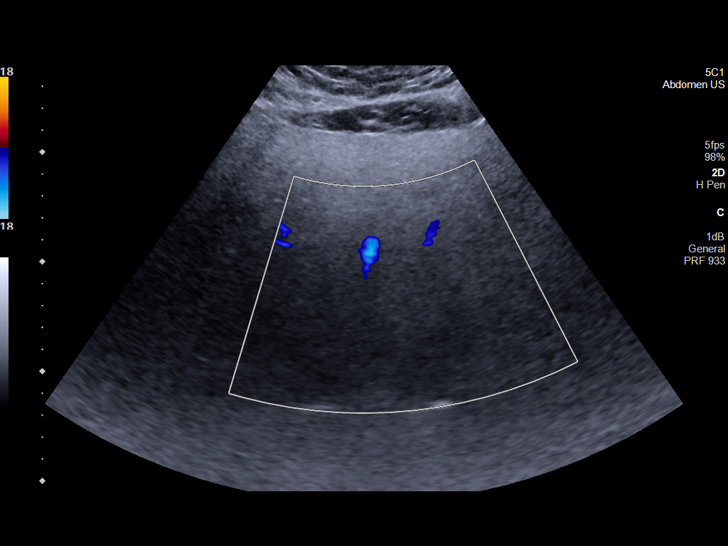
[im 31/37]
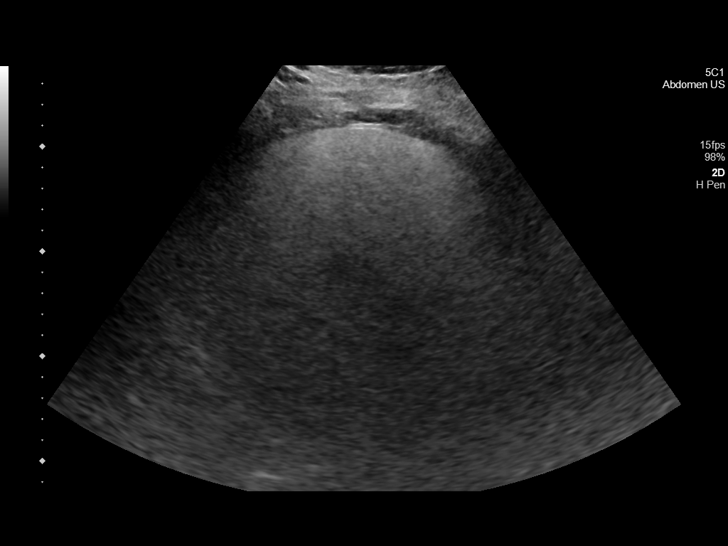
[im 34/37]
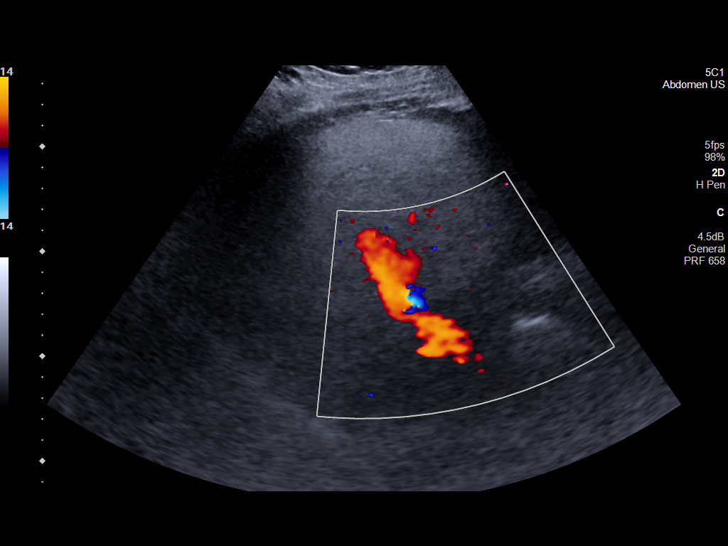
[im 37/37]
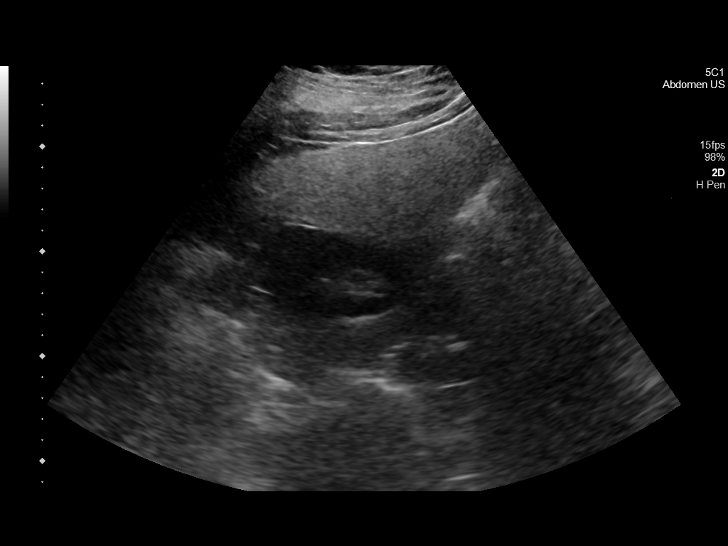

[13 of 25 positions shown; findings below may reference images not displayed]

FINDINGS: Gallbladder:

No gallstones or wall thickening visualized. No sonographic Murphy
sign noted by sonographer.

Common bile duct:

Diameter: 2.2 mm, nondilated

Liver:

Diffusely increased hepatic echogenicity with loss of definition of
the portal triads and diminished posterior through transmission
compatible with hepatic steatosis. Slightly nodular liver surface
contour. Posterior aspects of the liver are difficult to assess
given diminished penetration. No visible liver lesions within these
limitations. Portal vein is patent on color Doppler imaging with
normal direction of blood flow towards the liver.

Other: None.
IMPRESSION: 1. Diffusely increased hepatic echogenicity with significantly
diminished posterior through transmission. Can be seen in the
setting of severe hepatic steatosis. More nodular liver surface
contour changes could reflect some intrinsic liver disease/cirrhosis
as well.
2. No visible lesions though posterior aspect of the liver is poorly
visualized due to diminished penetration.
3. Otherwise unremarkable right upper quadrant ultrasound.

## 2022-08-17 ENCOUNTER — Other Ambulatory Visit: Payer: Self-pay

## 2022-08-17 ENCOUNTER — Emergency Department
Admission: EM | Admit: 2022-08-17 | Discharge: 2022-08-17 | Disposition: A | Discharge status: Home / Self Care | Payer: Managed Care, Other (non HMO) | Attending: Emergency Medicine | Admitting: Emergency Medicine

## 2022-08-17 DIAGNOSIS — T551X1A Toxic effect of detergents, accidental (unintentional), initial encounter: Secondary | ICD-10-CM | POA: Diagnosis not present

## 2022-08-17 DIAGNOSIS — T2661XA Corrosion of cornea and conjunctival sac, right eye, initial encounter: Secondary | ICD-10-CM | POA: Insufficient documentation

## 2022-08-17 DIAGNOSIS — T2662XA Corrosion of cornea and conjunctival sac, left eye, initial encounter: Secondary | ICD-10-CM | POA: Insufficient documentation

## 2022-08-17 DIAGNOSIS — X58XXXA Exposure to other specified factors, initial encounter: Secondary | ICD-10-CM | POA: Diagnosis not present

## 2022-08-17 DIAGNOSIS — H5789 Other specified disorders of eye and adnexa: Secondary | ICD-10-CM | POA: Diagnosis present

## 2022-08-17 DIAGNOSIS — T543X1A Toxic effect of corrosive alkalis and alkali-like substances, accidental (unintentional), initial encounter: Secondary | ICD-10-CM

## 2022-08-17 DIAGNOSIS — Y929 Unspecified place or not applicable: Secondary | ICD-10-CM | POA: Insufficient documentation

## 2022-08-17 MED ORDER — SODIUM CHLORIDE 0.9 % IV SOLN: Freq: Once | INTRAVENOUS | Status: AC

## 2022-08-17 MED ORDER — ATROPINE SULFATE 1 % OP SOLN: 2.0000 [drp] | Freq: Three times a day (TID) | OPHTHALMIC | 12 refills | Status: AC

## 2022-08-17 MED ORDER — FLUORESCEIN SODIUM 1 MG OP STRP
1.0000 | ORAL_STRIP | Freq: Once | OPHTHALMIC | Status: AC
  Administered 2022-08-17: 1 via OPHTHALMIC
  Filled 2022-08-17: qty 1

## 2022-08-17 MED ORDER — TETRACAINE HCL 0.5 % OP SOLN
1.0000 [drp] | Freq: Once | OPHTHALMIC | Status: AC
  Administered 2022-08-17: 1 [drp] via OPHTHALMIC
  Filled 2022-08-17: qty 4

## 2022-08-17 MED ORDER — LORAZEPAM 1 MG PO TABS
0.5000 mg | ORAL_TABLET | Freq: Once | ORAL | Status: AC
  Administered 2022-08-17: 0.5 mg via ORAL
  Filled 2022-08-17: qty 1

## 2022-08-17 MED ORDER — SODIUM CHLORIDE 0.9 % IV BOLUS
500.0000 mL | Freq: Once | INTRAVENOUS | Status: AC
  Administered 2022-08-17: 500 mL via INTRAVENOUS

## 2022-08-17 MED ORDER — HYPROMELLOSE (GONIOSCOPIC) 2.5 % OP SOLN: 1.0000 [drp] | OPHTHALMIC | 12 refills | Status: AC | PRN

## 2022-08-17 MED ORDER — POLYMYXIN B-TRIMETHOPRIM 10000-0.1 UNIT/ML-% OP SOLN: 1.0000 [drp] | Freq: Four times a day (QID) | OPHTHALMIC | 0 refills | Status: AC

## 2022-08-17 MED ORDER — LORAZEPAM 1 MG PO TABS
1.0000 mg | ORAL_TABLET | Freq: Once | ORAL | Status: AC
  Administered 2022-08-17: 1 mg via ORAL
  Filled 2022-08-17: qty 1

## 2022-08-17 MED ORDER — PREDNISOLONE ACETATE 1 % OP SUSP: 1.0000 [drp] | Freq: Four times a day (QID) | OPHTHALMIC | 0 refills | Status: AC

## 2022-08-17 MED ORDER — LORAZEPAM 1 MG PO TABS: 1.0000 mg | ORAL_TABLET | Freq: Three times a day (TID) | ORAL | 0 refills | Status: AC | PRN

## 2022-08-17 MED ORDER — ERYTHROMYCIN 5 MG/GM OP OINT: 1.0000 | TOPICAL_OINTMENT | Freq: Every day | OPHTHALMIC | 0 refills | Status: AC

## 2022-08-17 NOTE — ED Notes (Signed)
Patient is having difficulty tolerating NS going into his eyes. Dr. Cyril Loosen aware.

## 2022-08-17 NOTE — ED Notes (Signed)
Patient tolerated  ell the 2nd flushing of both eyes with of NS each . Wife remains at bedside. Dr. Cyril Loosen is aware that the procedure is finished.

## 2022-08-17 NOTE — ED Notes (Signed)
Patient tolerated getting his eyes flushed after Dr. Cyril Loosen administered the Tetracaine in each eye. Dr. Cyril Loosen is aware that procedure is done.  Wife at bedside.
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
## 2022-08-17 NOTE — ED Triage Notes (Signed)
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 Pt states pain in both eyes. Pt states he did flush eyes out for about an hour PTA.

## 2022-11-15 ENCOUNTER — Encounter: Payer: Self-pay | Admitting: Emergency Medicine

## 2022-11-15 ENCOUNTER — Ambulatory Visit
Admission: EM | Admit: 2022-11-15 | Discharge: 2022-11-15 | Disposition: A | Discharge status: Home / Self Care | Payer: Managed Care, Other (non HMO) | Attending: Emergency Medicine | Admitting: Emergency Medicine

## 2022-11-15 ENCOUNTER — Ambulatory Visit (INDEPENDENT_AMBULATORY_CARE_PROVIDER_SITE_OTHER): Payer: Managed Care, Other (non HMO)

## 2022-11-15 DIAGNOSIS — U071 COVID-19: Secondary | ICD-10-CM | POA: Diagnosis not present

## 2022-11-15 MED ORDER — ALBUTEROL SULFATE HFA 108 (90 BASE) MCG/ACT IN AERS: 2.0000 | INHALATION_SPRAY | RESPIRATORY_TRACT | 0 refills | Status: AC | PRN

## 2022-11-15 MED ORDER — BENZONATATE 100 MG PO CAPS: 200.0000 mg | ORAL_CAPSULE | Freq: Three times a day (TID) | ORAL | 0 refills | Status: AC

## 2022-11-15 MED ORDER — AEROCHAMBER MV MISC: 2 refills | Status: AC

## 2022-11-15 MED ORDER — PROMETHAZINE-DM 6.25-15 MG/5ML PO SYRP: 5.0000 mL | ORAL_SOLUTION | Freq: Four times a day (QID) | ORAL | 0 refills | Status: AC | PRN

## 2022-11-15 NOTE — ED Triage Notes (Signed)
Patient was diagnosed with COVID on Monday.  Patient reports SOB and ongoing chills and fatigue.  Patient states that he tried going to work today but did not feel better.

## 2022-11-15 NOTE — Discharge Instructions (Addendum)
As we discussed, you have been diagnosed with COVID-19 and this is an inflammatory process.  Cough, shortness of breath, and chest tightness can last 6 weeks or longer.  If it goes beyond 6 weeks then you classify as what is called long COVID.  Use the albuterol inhaler with a spacer, 2 puffs every 4-6 hours, as needed for shortness of breath and any wheezing that may develop.  Use over-the-counter ibuprofen or Aleve according to the package instructions as needed for body aches.  Rest and drink plenty of fluids to maintain hydration.  I do recommend that you get up and walk 1 to 2 miles a day to help with your joint pain and also your back pain.  This will also help open up your lungs and help your breathing.  Use the Tessalon Perles every 8 hours as needed during the day for cough.  Take them with a small sip of water.  They may give you numbness to the base of your tongue or metallic taste in her mouth, this is normal.  Use the Promethazine DM cough syrup at bedtime as needed for cough and congestion.  This medication will make you sleepy.  If you develop any new or worsening symptoms please return for reevaluation or see your primary care provider.

## 2022-11-15 NOTE — ED Provider Notes (Signed)
[facility]    CSN: [id number] Arrival date & time: 11/15/22  1554      History   Chief Complaint Chief Complaint  Patient presents with   Fatigue   Chills   Generalized Body Aches    HPI Zachary Alexander is a 37 y.o. male.   HPI  37 year old male with past medical history significant for diabetes presents for evaluation of ongoing fatigue, shortness of breath, and chills in the setting of recent COVID diagnosis.  He is also complaining of significant pain in his back.  His cough is nonproductive.  He denies any fever or wheezing.  He was unable to so he left part way through the day.  Past Medical History:  Diagnosis Date   Diabetes mellitus without complication [facility])     Patient Active Problem List   Diagnosis Date Noted   Varicose veins of leg with pain, bilateral 10/29/2016   Swelling of limb 10/08/2016   Diabetes mellitus without complication (HCC) 10/08/2016   Pain in limb 10/08/2016    Past Surgical History:  Procedure Laterality Date   CYST REMOVAL NECK     TONSILLECTOMY         Home Medications    Prior to Admission medications   Medication Sig Start Date End Date Taking? Authorizing Provider  albuterol (VENTOLIN HFA) 108 (90 Base) MCG/ACT inhaler Inhale 2 puffs into the lungs every 4 (four) hours as needed. 11/15/22  Yes Becky Augusta, NP  benzonatate (TESSALON) 100 MG capsule Take 2 capsules (200 mg total) by mouth every 8 (eight) hours. 11/15/22  Yes Becky Augusta, NP  promethazine-dextromethorphan (PROMETHAZINE-DM) 6.25-15 MG/5ML syrup Take 5 mLs by mouth 4 (four) times daily as needed. 11/15/22  Yes Becky Augusta, NP  Spacer/Aero-Holding Chambers (AEROCHAMBER MV) inhaler Use as instructed 11/15/22  Yes Becky Augusta, NP  atropine 1 % ophthalmic solution Place 2 drops into both eyes 3 (three) times daily. 08/17/22   Jene Every, MD  busPIRone (BUSPAR) 10 MG tablet Take 10 mg by mouth 2 (two) times daily. 10/11/21   [provider]   erythromycin ophthalmic ointment Place 1 Application into both eyes at bedtime. 08/17/22   Jene Every, MD  gabapentin (NEURONTIN) 400 MG capsule Take 400 mg by mouth 3 (three) times daily. 10/11/21   [provider]  glimepiride (AMARYL) 4 MG tablet Take 4 mg by mouth 2 (two) times daily. 09/21/21   [provider]  hydroxypropyl methylcellulose / hypromellose (ISOPTO TEARS / GONIOVISC) 2.5 % ophthalmic solution Place 1 drop into both eyes every hour as needed for dry eyes. 08/17/22   Jene Every, MD  losartan (COZAAR) 25 MG tablet Take 25 mg by mouth daily. 07/27/21   [provider]  metFORMIN (GLUCOPHAGE-XR) 500 MG 24 hr tablet Take 1,000 mg by mouth 2 (two) times daily. 07/27/21   [provider]  MOUNJARO 5 MG/0.5ML Pen Inject 0.5 mLs into the skin once a week. 11/15/21   [provider]  prednisoLONE acetate (PRED FORTE) 1 % ophthalmic suspension Place 1 drop into both eyes 4 (four) times daily. 08/17/22   Jene Every, MD  rosuvastatin (CRESTOR) 10 MG tablet Take 10 mg by mouth daily. 11/16/21   [provider]  sertraline (ZOLOFT) 100 MG tablet Take 100 mg by mouth daily. 10/11/21   [provider]  trimethoprim-polymyxin b (POLYTRIM) ophthalmic solution Place 1 drop into both eyes every 6 (six) hours. 08/17/22   Jene Every, MD    Family History  Family History  Problem Relation Age of Onset   Liver disease Father    Diabetes Paternal Grandmother    Heart attack Paternal Grandmother    Diabetes Paternal Grandfather     Social History Social History   Tobacco Use   Smoking status: Never   Smokeless tobacco: Never  Vaping Use   Vaping status: Never Used  Substance Use Topics   Alcohol use: Yes    Comment: rare   Drug use: No     Allergies   Penicillins, Codeine, and Amoxicillin   Review of Systems Review of Systems  Constitutional:  Positive for chills and fatigue. Negative for fever.  Respiratory:   Positive for cough and shortness of breath. Negative for wheezing.      Physical Exam Triage Vital Signs ED Triage Vitals  Encounter Vitals Group     BP      Systolic BP Percentile      Diastolic BP Percentile      Pulse      Resp      Temp      Temp src      SpO2      Weight      Height      Head Circumference      Peak Flow      Pain Score      Pain Loc      Pain Education      Exclude from Growth Chart    No data found.  Updated Vital Signs BP (!) 142/92 (BP Location: Right Arm)   Pulse 82   Temp 97.9 F (36.6 C) (Oral)   Resp 16   Ht 5\' 10"  (1.778 m)   Wt 251 lb 15.8 oz (114.3 kg)   SpO2 97%   BMI 36.16 kg/m   Visual Acuity Right Eye Distance:   Left Eye Distance:   Bilateral Distance:    Right Eye Near:   Left Eye Near:    Bilateral Near:     Physical Exam Vitals and nursing note reviewed.  Constitutional:      Appearance: Normal appearance. He is not ill-appearing.  HENT:     Head: Normocephalic and atraumatic.  Cardiovascular:     Rate and Rhythm: Normal rate and regular rhythm.     Pulses: Normal pulses.     Heart sounds: Normal heart sounds. No murmur heard.    No friction rub. No gallop.  Pulmonary:     Effort: Pulmonary effort is normal.     Breath sounds: Normal breath sounds. No wheezing, rhonchi or rales.  Skin:    General: Skin is warm and dry.     Capillary Refill: Capillary refill takes less than 2 seconds.     Findings: No rash.  Neurological:     General: No focal deficit present.     Mental Status: He is alert and oriented to person, place, and time.      UC Treatments / Results  Labs (all labs ordered are listed, but only abnormal results are displayed) Labs Reviewed - No data to display  EKG   Radiology No results found.  Procedures Procedures (including critical care time)  Medications Ordered in UC Medications - No data to display  Initial Impression / Assessment and Plan / UC Course  I have reviewed  the triage vital signs and the nursing notes.  Pertinent labs & imaging results that were available during my care of the patient were reviewed by me and considered in  my medical decision making (see chart for details).   Patient is a pleasant, nontoxic-appearing 37 year old male presenting for evaluation of ongoing shortness of breath, fatigue, and chills as outlined HPI above.  He is able to speak in full sentence without dyspnea or tachypnea.  He is afebrile with an oral temp of 97 9 here in clinic.  Respiratory rate at triage was 16 and his room air oxygen saturation was 97 percent.  Cardiopulmonary exam reveals S1-S2 heart sounds with regular rate and rhythm and lung sounds that are clear to auscultation all fields.  The patient is concerned he may have pneumonia though he is not having any sputum production and his cough is infrequent.  I will obtain a chest x-ray to rule out any acute cardiopulmonary process.  Chest x-ray independently reviewed and evaluated by me.  Impression: No evidence of infiltrate or effusion.  Cardiomediastinal silhouette appears normal.  Radiology overread is pending.  I will discharge patient home with a diagnosis of COVID-19 and prescribe an albuterol inhaler with a spacer that he can use every 4-6 hours as needed for shortness of breath.  Also Tessalon Perles and Promethazine DM cough syrup he can use as needed for cough and congestion.  Over-the-counter Tylenol and/or ibuprofen as needed for pain.  Work note provided.   Final Clinical Impressions(s) / UC Diagnoses   Final diagnoses:  COVID-19     Discharge Instructions      As we discussed, you have been diagnosed with COVID-19 and this is an inflammatory process.  Cough, shortness of breath, and chest tightness can last 6 weeks or longer.  If it goes beyond 6 weeks then you classify as what is called long COVID.  Use the albuterol inhaler with a spacer, 2 puffs every 4-6 hours, as needed for shortness of  breath and any wheezing that may develop.  Use over-the-counter ibuprofen or Aleve according to the package instructions as needed for body aches.  Rest and drink plenty of fluids to maintain hydration.  I do recommend that you get up and walk 1 to 2 miles a day to help with your joint pain and also your back pain.  This will also help open up your lungs and help your breathing.  Use the Tessalon Perles every 8 hours as needed during the day for cough.  Take them with a small sip of water.  They may give you numbness to the base of your tongue or metallic taste in her mouth, this is normal.  Use the Promethazine DM cough syrup at bedtime as needed for cough and congestion.  This medication will make you sleepy.  If you develop any new or worsening symptoms please return for reevaluation or see your primary care provider.     ED Prescriptions     Medication Sig Dispense Auth. Provider   Spacer/Aero-Holding Chambers (AEROCHAMBER MV) inhaler Use as instructed 1 each Becky Augusta, NP   albuterol (VENTOLIN HFA) 108 (90 Base) MCG/ACT inhaler Inhale 2 puffs into the lungs every 4 (four) hours as needed. 18 g Becky Augusta, NP   benzonatate (TESSALON) 100 MG capsule Take 2 capsules (200 mg total) by mouth every 8 (eight) hours. 21 capsule Becky Augusta, NP   promethazine-dextromethorphan (PROMETHAZINE-DM) 6.25-15 MG/5ML syrup Take 5 mLs by mouth 4 (four) times daily as needed. 118 mL Becky Augusta, NP      PDMP not reviewed this encounter.   Becky Augusta, NP 11/15/22 [phone]
